# Patient Record
Sex: Male | Born: 1945 | Race: White | Hispanic: No | Marital: Married | Smoking: Former smoker
Health system: Southern US, Community
[De-identification: ages and names within clinical notes are randomized; demographics above are authoritative.]

## PROBLEM LIST (undated history)

## (undated) DIAGNOSIS — H8109 Meniere's disease, unspecified ear: Secondary | ICD-10-CM

## (undated) DIAGNOSIS — E785 Hyperlipidemia, unspecified: Secondary | ICD-10-CM

## (undated) DIAGNOSIS — M1990 Unspecified osteoarthritis, unspecified site: Secondary | ICD-10-CM

## (undated) DIAGNOSIS — H9192 Unspecified hearing loss, left ear: Secondary | ICD-10-CM

## (undated) DIAGNOSIS — I10 Essential (primary) hypertension: Secondary | ICD-10-CM

## (undated) DIAGNOSIS — H269 Unspecified cataract: Secondary | ICD-10-CM

## (undated) HISTORY — DX: Unspecified cataract: H26.9

## (undated) HISTORY — PX: POLYPECTOMY: SHX149

## (undated) HISTORY — DX: Hyperlipidemia, unspecified: E78.5

## (undated) HISTORY — DX: Essential (primary) hypertension: I10

## (undated) HISTORY — PX: COLONOSCOPY: SHX174

## (undated) HISTORY — DX: Meniere's disease, unspecified ear: H81.09

## (undated) HISTORY — DX: Unspecified hearing loss, left ear: H91.92

## (undated) HISTORY — PX: UPPER GASTROINTESTINAL ENDOSCOPY: SHX188

## (undated) HISTORY — DX: Unspecified osteoarthritis, unspecified site: M19.90

---

## 2005-03-15 ENCOUNTER — Ambulatory Visit: Payer: Self-pay | Admitting: Internal Medicine

## 2005-06-27 ENCOUNTER — Ambulatory Visit: Payer: Self-pay | Admitting: Internal Medicine

## 2005-07-20 ENCOUNTER — Ambulatory Visit: Payer: Self-pay | Admitting: Internal Medicine

## 2005-08-24 ENCOUNTER — Ambulatory Visit: Payer: Self-pay | Admitting: Internal Medicine

## 2005-09-26 ENCOUNTER — Ambulatory Visit: Payer: Self-pay | Admitting: Internal Medicine

## 2006-03-19 ENCOUNTER — Ambulatory Visit: Payer: Self-pay | Admitting: Internal Medicine

## 2006-03-26 ENCOUNTER — Ambulatory Visit: Payer: Self-pay | Admitting: Internal Medicine

## 2006-06-07 ENCOUNTER — Ambulatory Visit: Payer: Self-pay | Admitting: Internal Medicine

## 2006-06-19 ENCOUNTER — Ambulatory Visit: Payer: Self-pay | Admitting: Internal Medicine

## 2006-06-26 ENCOUNTER — Ambulatory Visit: Payer: Self-pay | Admitting: Internal Medicine

## 2006-06-26 LAB — CONVERTED CEMR LAB
Basophils Relative: 0.1 % (ref 0.0–1.0)
Hemoglobin: 14.9 g/dL (ref 13.0–17.0)
INR: 1 (ref 0.9–2.0)
Lymphocytes Relative: 31.7 % (ref 12.0–46.0)
MCHC: 33.9 g/dL (ref 30.0–36.0)
Monocytes Absolute: 0.7 10*3/uL (ref 0.2–0.7)
Monocytes Relative: 8.4 % (ref 3.0–11.0)
Neutro Abs: 5.1 10*3/uL (ref 1.4–7.7)
Platelets: 176 10*3/uL (ref 150–400)
Prothrombin Time: 12.4 s (ref 10.0–14.0)
RDW: 12.8 % (ref 11.5–14.6)
aPTT: 27.9 s (ref 26.5–36.5)

## 2006-07-11 ENCOUNTER — Ambulatory Visit: Payer: Self-pay | Admitting: Internal Medicine

## 2007-03-19 ENCOUNTER — Ambulatory Visit: Payer: Self-pay | Admitting: Internal Medicine

## 2007-03-20 ENCOUNTER — Ambulatory Visit: Payer: Self-pay | Admitting: Internal Medicine

## 2007-03-20 LAB — CONVERTED CEMR LAB
ALT: 23 units/L (ref 0–53)
AST: 27 units/L (ref 0–37)
Albumin: 3.7 g/dL (ref 3.5–5.2)
Alkaline Phosphatase: 69 units/L (ref 39–117)
BUN: 13 mg/dL (ref 6–23)
Basophils Absolute: 0.1 10*3/uL (ref 0.0–0.1)
Basophils Relative: 0.9 % (ref 0.0–1.0)
Bilirubin, Direct: 0.1 mg/dL (ref 0.0–0.3)
CO2: 28 meq/L (ref 19–32)
Calcium: 8.9 mg/dL (ref 8.4–10.5)
Chloride: 107 meq/L (ref 96–112)
Cholesterol: 189 mg/dL (ref 0–200)
Creatinine, Ser: 1 mg/dL (ref 0.4–1.5)
Eosinophils Absolute: 0.3 10*3/uL (ref 0.0–0.6)
Eosinophils Relative: 3.5 % (ref 0.0–5.0)
GFR calc Af Amer: 97 mL/min
GFR calc non Af Amer: 80 mL/min
Glucose, Bld: 85 mg/dL (ref 70–99)
HCT: 42 % (ref 39.0–52.0)
HDL: 40.7 mg/dL (ref >39.0)
Hemoglobin: 14.7 g/dL (ref 13.0–17.0)
LDL Cholesterol: 123 mg/dL — ABNORMAL HIGH (ref 0–99)
Lymphocytes Relative: 32.6 % (ref 12.0–46.0)
MCHC: 35 g/dL (ref 30.0–36.0)
MCV: 96.6 fL (ref 78.0–100.0)
Monocytes Absolute: 0.9 10*3/uL — ABNORMAL HIGH (ref 0.2–0.7)
Monocytes Relative: 12.3 % — ABNORMAL HIGH (ref 3.0–11.0)
Neutro Abs: 3.8 10*3/uL (ref 1.4–7.7)
Neutrophils Relative %: 50.7 % (ref 43.0–77.0)
PSA: 0.24 ng/mL (ref 0.10–4.00)
Platelets: 182 10*3/uL (ref 150–400)
Potassium: 4.1 meq/L (ref 3.5–5.1)
RBC: 4.35 M/uL (ref 4.22–5.81)
RDW: 13 % (ref 11.5–14.6)
Sodium: 136 meq/L (ref 135–145)
TSH: 1.11 microintl units/mL (ref 0.35–5.50)
Total Bilirubin: 0.8 mg/dL (ref 0.3–1.2)
Total CHOL/HDL Ratio: 4.6
Total Protein: 6.8 g/dL (ref 6.0–8.3)
Triglycerides: 126 mg/dL (ref 0–149)
VLDL: 25 mg/dL (ref 0–40)
WBC: 7.5 10*3/uL (ref 4.5–10.5)

## 2007-03-26 ENCOUNTER — Ambulatory Visit: Payer: Self-pay | Admitting: Internal Medicine

## 2007-04-22 DIAGNOSIS — I10 Essential (primary) hypertension: Secondary | ICD-10-CM | POA: Insufficient documentation

## 2007-04-22 DIAGNOSIS — L719 Rosacea, unspecified: Secondary | ICD-10-CM | POA: Insufficient documentation

## 2007-04-25 ENCOUNTER — Ambulatory Visit: Payer: Self-pay | Admitting: Internal Medicine

## 2007-04-25 DIAGNOSIS — C44300 Unspecified malignant neoplasm of skin of unspecified part of face: Secondary | ICD-10-CM

## 2007-08-05 ENCOUNTER — Telehealth: Payer: Self-pay | Admitting: Internal Medicine

## 2008-03-17 ENCOUNTER — Ambulatory Visit: Payer: Self-pay | Admitting: Internal Medicine

## 2008-03-17 LAB — CONVERTED CEMR LAB
Albumin: 3.8 g/dL (ref 3.5–5.2)
BUN: 12 mg/dL (ref 6–23)
Calcium: 8.9 mg/dL (ref 8.4–10.5)
Eosinophils Relative: 2.9 % (ref 0.0–5.0)
GFR calc Af Amer: 97 mL/min
Glucose, Bld: 99 mg/dL (ref 70–99)
Glucose, Urine, Semiquant: NEGATIVE
HCT: 45.2 % (ref 39.0–52.0)
Hemoglobin: 15.8 g/dL (ref 13.0–17.0)
Monocytes Absolute: 0.6 10*3/uL (ref 0.1–1.0)
Monocytes Relative: 9.3 % (ref 3.0–12.0)
Neutro Abs: 3.8 10*3/uL (ref 1.4–7.7)
Nitrite: NEGATIVE
RDW: 12.6 % (ref 11.5–14.6)
Specific Gravity, Urine: 1.025
TSH: 1.04 microintl units/mL (ref 0.35–5.50)
Total CHOL/HDL Ratio: 4.2
Total Protein: 6.5 g/dL (ref 6.0–8.3)
Triglycerides: 72 mg/dL (ref 0–149)
WBC: 6.9 10*3/uL (ref 4.5–10.5)
pH: 5.5

## 2008-03-25 ENCOUNTER — Ambulatory Visit: Payer: Self-pay | Admitting: Internal Medicine

## 2008-03-25 DIAGNOSIS — H9190 Unspecified hearing loss, unspecified ear: Secondary | ICD-10-CM | POA: Insufficient documentation

## 2008-03-25 DIAGNOSIS — E785 Hyperlipidemia, unspecified: Secondary | ICD-10-CM | POA: Insufficient documentation

## 2008-03-25 DIAGNOSIS — H8109 Meniere's disease, unspecified ear: Secondary | ICD-10-CM

## 2008-05-04 ENCOUNTER — Encounter: Payer: Self-pay | Admitting: Internal Medicine

## 2008-09-14 ENCOUNTER — Ambulatory Visit: Payer: Self-pay | Admitting: Internal Medicine

## 2008-09-14 LAB — CONVERTED CEMR LAB
ALT: 24 units/L (ref 0–53)
Alkaline Phosphatase: 56 units/L (ref 39–117)
Bilirubin, Direct: 0.1 mg/dL (ref 0.0–0.3)
Cholesterol: 185 mg/dL (ref 0–200)
LDL Cholesterol: 124 mg/dL — ABNORMAL HIGH (ref 0–99)
Total Protein: 6.5 g/dL (ref 6.0–8.3)

## 2008-09-23 ENCOUNTER — Ambulatory Visit: Payer: Self-pay | Admitting: Internal Medicine

## 2008-09-23 DIAGNOSIS — M24119 Other articular cartilage disorders, unspecified shoulder: Secondary | ICD-10-CM | POA: Insufficient documentation

## 2008-09-26 ENCOUNTER — Encounter: Admission: RE | Admit: 2008-09-26 | Discharge: 2008-09-26 | Payer: Self-pay | Admitting: Internal Medicine

## 2008-09-29 ENCOUNTER — Telehealth: Payer: Self-pay | Admitting: Internal Medicine

## 2008-09-29 DIAGNOSIS — M719 Bursopathy, unspecified: Secondary | ICD-10-CM

## 2008-09-29 DIAGNOSIS — M67919 Unspecified disorder of synovium and tendon, unspecified shoulder: Secondary | ICD-10-CM | POA: Insufficient documentation

## 2008-11-04 ENCOUNTER — Telehealth: Payer: Self-pay | Admitting: Internal Medicine

## 2008-11-04 ENCOUNTER — Ambulatory Visit: Payer: Self-pay | Admitting: Internal Medicine

## 2008-11-04 DIAGNOSIS — R05 Cough: Secondary | ICD-10-CM | POA: Insufficient documentation

## 2009-03-22 ENCOUNTER — Ambulatory Visit: Payer: Self-pay | Admitting: Internal Medicine

## 2009-03-22 LAB — CONVERTED CEMR LAB
ALT: 17 units/L (ref 0–53)
AST: 27 units/L (ref 0–37)
Albumin: 3.8 g/dL (ref 3.5–5.2)
Alkaline Phosphatase: 52 units/L (ref 39–117)
Basophils Relative: 0 % (ref 0.0–3.0)
Bilirubin, Direct: 0.1 mg/dL (ref 0.0–0.3)
CO2: 30 meq/L (ref 19–32)
Calcium: 9.1 mg/dL (ref 8.4–10.5)
Eosinophils Relative: 3.7 % (ref 0.0–5.0)
GFR calc non Af Amer: 79.59 mL/min (ref >60)
Hemoglobin: 14.5 g/dL (ref 13.0–17.0)
Ketones, urine, test strip: NEGATIVE
Lymphocytes Relative: 33.5 % (ref 12.0–46.0)
MCHC: 34.7 g/dL (ref 30.0–36.0)
Monocytes Relative: 10.9 % (ref 3.0–12.0)
Neutro Abs: 3.6 10*3/uL (ref 1.4–7.7)
Nitrite: NEGATIVE
RBC: 4.3 M/uL (ref 4.22–5.81)
Sodium: 143 meq/L (ref 135–145)
Total CHOL/HDL Ratio: 4
Total Protein: 6.9 g/dL (ref 6.0–8.3)
Triglycerides: 67 mg/dL (ref 0.0–149.0)
Urobilinogen, UA: 0.2
VLDL: 13.4 mg/dL (ref 0.0–40.0)
WBC: 7.1 10*3/uL (ref 4.5–10.5)

## 2009-03-26 ENCOUNTER — Ambulatory Visit: Payer: Self-pay | Admitting: Internal Medicine

## 2009-03-26 DIAGNOSIS — M7730 Calcaneal spur, unspecified foot: Secondary | ICD-10-CM | POA: Insufficient documentation

## 2009-05-03 ENCOUNTER — Encounter: Payer: Self-pay | Admitting: Internal Medicine

## 2009-09-01 ENCOUNTER — Telehealth: Payer: Self-pay | Admitting: Internal Medicine

## 2009-09-01 ENCOUNTER — Emergency Department (HOSPITAL_COMMUNITY): Admission: EM | Admit: 2009-09-01 | Discharge: 2009-09-01 | Payer: Self-pay | Admitting: Emergency Medicine

## 2009-09-07 ENCOUNTER — Telehealth: Payer: Self-pay | Admitting: Internal Medicine

## 2009-09-08 ENCOUNTER — Encounter: Admission: RE | Admit: 2009-09-08 | Discharge: 2009-09-08 | Payer: Self-pay | Admitting: Internal Medicine

## 2009-09-08 ENCOUNTER — Ambulatory Visit: Payer: Self-pay | Admitting: Internal Medicine

## 2009-09-08 DIAGNOSIS — IMO0002 Reserved for concepts with insufficient information to code with codable children: Secondary | ICD-10-CM

## 2009-09-21 ENCOUNTER — Encounter: Admission: RE | Admit: 2009-09-21 | Discharge: 2009-10-08 | Payer: Self-pay | Admitting: Internal Medicine

## 2009-09-22 ENCOUNTER — Encounter: Payer: Self-pay | Admitting: Internal Medicine

## 2009-10-07 ENCOUNTER — Ambulatory Visit: Payer: Self-pay | Admitting: Internal Medicine

## 2009-11-04 ENCOUNTER — Telehealth: Payer: Self-pay | Admitting: Internal Medicine

## 2010-03-28 ENCOUNTER — Ambulatory Visit: Payer: Self-pay | Admitting: Internal Medicine

## 2010-03-28 LAB — CONVERTED CEMR LAB
ALT: 23 units/L (ref 0–53)
AST: 27 units/L (ref 0–37)
Albumin: 4 g/dL (ref 3.5–5.2)
Alkaline Phosphatase: 62 units/L (ref 39–117)
Basophils Relative: 0.4 % (ref 0.0–3.0)
Cholesterol: 213 mg/dL — ABNORMAL HIGH (ref 0–200)
Direct LDL: 131.8 mg/dL
Eosinophils Absolute: 0.2 10*3/uL (ref 0.0–0.7)
Eosinophils Relative: 2 % (ref 0.0–5.0)
Hemoglobin: 14.4 g/dL (ref 13.0–17.0)
Lymphocytes Relative: 27.3 % (ref 12.0–46.0)
MCHC: 34.1 g/dL (ref 30.0–36.0)
MCV: 98.2 fL (ref 78.0–100.0)
Neutro Abs: 5.3 10*3/uL (ref 1.4–7.7)
RBC: 4.31 M/uL (ref 4.22–5.81)
WBC: 9.1 10*3/uL (ref 4.5–10.5)

## 2010-04-06 ENCOUNTER — Ambulatory Visit: Payer: Self-pay | Admitting: Internal Medicine

## 2010-04-06 LAB — CONVERTED CEMR LAB

## 2010-07-01 ENCOUNTER — Ambulatory Visit: Payer: Self-pay | Admitting: Internal Medicine

## 2010-07-01 LAB — CONVERTED CEMR LAB
ALT: 23 units/L (ref 0–53)
Albumin: 3.7 g/dL (ref 3.5–5.2)
Bilirubin, Direct: 0.1 mg/dL (ref 0.0–0.3)
Direct LDL: 145.8 mg/dL
HDL: 47.4 mg/dL (ref >39.00)
Total Protein: 6.4 g/dL (ref 6.0–8.3)
Triglycerides: 84 mg/dL (ref 0.0–149.0)
VLDL: 16.8 mg/dL (ref 0.0–40.0)

## 2010-07-29 ENCOUNTER — Ambulatory Visit: Payer: Self-pay | Admitting: Internal Medicine

## 2010-07-29 LAB — CONVERTED CEMR LAB
Cholesterol, target level: 200 mg/dL
LDL Goal: 130 mg/dL

## 2010-10-02 ENCOUNTER — Encounter: Payer: Self-pay | Admitting: Internal Medicine

## 2010-10-12 NOTE — Assessment & Plan Note (Signed)
Summary: 1 month rov/njr   Vital Signs:  Patient profile:   65 year old male Height:      68 inches Weight:      198 pounds BMI:     30.21 Temp:     98.2 degrees F oral Pulse rate:   72 / minute Resp:     14 per minute BP sitting:   130 / 80  (left arm)  Vitals Entered By: Willy Eddy, LPN (October 07, 2009 10:15 AM) CC: roa - back pain has improved, Hypertension Management   CC:  roa - back pain has improved and Hypertension Management.  History of Present Illness: PAIN CNTROL ACHIEVED WITH PT reviewed the pt results  Hypertension History:      He denies headache, chest pain, palpitations, dyspnea with exertion, orthopnea, PND, peripheral edema, visual symptoms, neurologic problems, syncope, and side effects from treatment.        Positive major cardiovascular risk factors include male age 37 years old or older, hyperlipidemia, and hypertension.  Negative major cardiovascular risk factors include non-tobacco-user status.     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 Prn 2)  Valium 5 Mg  Tabs (Diazepam) .Marland Kitchen.. 1 Once Daily As Needed 3)  Fish Oil Concentrate 1000 Mg  Caps (Omega-3 Fatty Acids) .... Two By Mouth Once Daily 4)  Lotensin 20 Mg  Tabs  (Benazepril Hcl) .... One By Mouth Daily 5)  Methylprednisolone 4 Mg Tabs (Methylprednisolone) .... 4 By Mouth Daily For 4 Days The 3 By Mouth For 4 Days Then 2 By Mouth For 4 Days Then 1 By Mouth For 4 Days 6)  Cyclobenzaprine Hcl 10 Mg Tabs (Cyclobenzaprine Hcl) .... One By Mouth Tid 7)  Oxycontin 20 Mg Xr12h-Tab (Oxycodone Hcl) .... One By Mouth Two Times A Day Prn Pain  Current Medications (verified): 1)  Metrogel 1 %  Gel (Metronidazole) .... Prn 2)  Valium 5 Mg  Tabs (Diazepam) .Marland Kitchen.. 1 Once Daily As Needed 3)  Fish Oil Concentrate 1000 Mg  Caps (Omega-3 Fatty Acids) .... Two By Mouth Once Daily 4)  Lotensin 20 Mg  Tabs (Benazepril Hcl) .... One By Mouth Daily  Allergies (verified): 1)  ! Tetracycline Hcl (Tetracycline Hcl) 2)  Percocet  Past History:  Family History: Last updated: 04/23/2007 Family History Gangreene Family History CVA Family History Hypertension  Social History: Last updated: 04/25/2007 Retired Married Never Smoked  Risk Factors: Alcohol Use: 2 (10/07/2009) Caffeine Use: 2 (04/25/2007) Exercise: yes (04/25/2007)  Risk Factors: Smoking Status: never (10/07/2009) Passive Smoke Exposure: no (10/07/2009)  Past medical, surgical, family and social histories (including risk factors) reviewed, and no changes noted (except as noted below).  Past Medical History: Reviewed history from 04/23/2007 and no changes  required. Chest Pain Meinere Disease Left Ear Hearing Loss  Past Surgical History: Reviewed history from 04/23/2007 and no changes required. Colonoscopy  Family History: Reviewed history from 04/23/2007 and no changes required. Family History Gangreene Family History CVA Family History Hypertension  Social History: Reviewed history from 04/25/2007 and no changes required. Retired Married Never Smoked  Review of Systems  The patient denies anorexia, fever, weight loss, weight gain, vision loss, decreased hearing, hoarseness, chest  pain, syncope, dyspnea on exertion, peripheral edema, prolonged cough, headaches, hemoptysis, abdominal pain, melena, hematochezia, severe indigestion/heartburn, hematuria, incontinence, genital sores, muscle weakness, suspicious skin lesions, transient blindness, difficulty walking, depression, unusual weight change, abnormal bleeding, enlarged lymph nodes, angioedema, breast masses, and testicular masses.    Physical Exam  General:  anxious appearing.   Head:  normocephalic and atraumatic.   Eyes:  pupils equal and pupils round.   Ears:  R ear normal and L ear normal.   Nose:  no external deformity and no nasal discharge.   Mouth:  Oral mucosa and oropharynx without lesions or exudates.  Teeth in good repair. Neck:  No deformities, masses, or tenderness noted. Lungs:  normal respiratory effort and no wheezes.   Heart:  normal rate and regular rhythm.     Impression & Recommendations:  Problem # 1:  LUMBAR RADICULOPATHY, LEFT (ICD-724.4)  The following medications were removed from the medication list:    Cyclobenzaprine Hcl 10 Mg Tabs (Cyclobenzaprine hcl) ..... One by mouth tid    Oxycontin 20 Mg Xr12h-tab (Oxycodone hcl) ..... One by mouth two times a day prn pain  Discussed use of moist heat or ice, modified activities, medications, and stretching/strengthening exercises. Back care instructions given. To be seen in 2 weeks if no improvement; sooner if worsening of symptoms.   Problem # 2:  HYPERLIPIDEMIA, MILD (ICD-272.4)  Labs Reviewed: SGOT: 27 (03/22/2009)   SGPT: 17 (03/22/2009)  10 Yr Risk Heart Disease: 18 % Prior 10 Yr Risk Heart Disease: 14 % (03/26/2009)   HDL:58.50 (03/22/2009), 49.2 (09/14/2008)  LDL:124 (09/14/2008), 132 (03/17/2008)  Chol:208 (03/22/2009), 185 (09/14/2008)  Trig:67.0 (03/22/2009), 59 (09/14/2008)  Problem # 3:  HYPERTENSION (ICD-401.9)  His updated medication list for this problem includes:    Lotensin 20 Mg Tabs (Benazepril hcl) ..... One by  mouth daily  BP today: 130/80 Prior BP: 144/80 (09/08/2009)  10 Yr Risk Heart Disease: 18 % Prior 10 Yr Risk Heart Disease: 14 % (03/26/2009)  Labs Reviewed: K+: 4.7 (03/22/2009) Creat: : 1.0 (03/22/2009)   Chol: 208 (03/22/2009)   HDL: 58.50 (03/22/2009)   LDL: 124 (09/14/2008)   TG: 67.0 (03/22/2009)  Problem # 4:  MENIERE'S DISEASE (ICD-386.00) stable  Complete Medication List: 1)  Metrogel 1 % Gel (Metronidazole) .... Prn 2)  Valium 5 Mg Tabs (Diazepam) .Marland Kitchen.. 1 once daily as needed 3)  Fish Oil Concentrate 1000 Mg Caps (Omega-3 fatty acids) .... Two by mouth once daily 4)  Lotensin 20 Mg Tabs (Benazepril hcl) .... One by mouth daily  Hypertension Assessment/Plan:      The patient's hypertensive risk group is category B: At least one risk factor (excluding diabetes) with no target organ damage.  His calculated 10 year risk of coronary heart disease is 18 %.  Today's blood pressure is 130/80.  His blood pressure goal is < 140/90.  Patient Instructions: 1)  six months 2)  30 min "initial medicare welness"  3)  Hepatic Panel prior to visit, ICD-9:995.20 4)  Lipid Panel prior to visit, ICD-9:272.4  5)  TSH prior to visit, ICD-9:272.4 6)  CBC w/ Diff prior to visit, ICD-9:995.20 7)  PSA prior to visit, ICD-9: 601.0

## 2010-10-12 NOTE — Assessment & Plan Note (Signed)
Summary: 30 min preventive/njr   Vital Signs:  Patient profile:   65 year old male Height:      68 inches Weight:      194 pounds BMI:     29.60 Temp:     98.2 degrees F oral Pulse rate:   72 / minute Resp:     14 per minute BP sitting:   120 / 70  (left arm)  Vitals Entered By: Willy Eddy, LPN (April 06, 2010 12:10 PM)  Contraindications/Deferment of Procedures/Staging:    Test/Procedure: Pneumovax vaccine    Reason for deferment: patient declined  CC: annual visit for disease management  Is Patient Diabetic? No  Vision Screening:Left eye with correction: 20 / 20 Right eye with correction: 20 / 20 Both eyes with correction: 20 / 20  Color vision testing: normal       CC:  annual visit for disease management .  History of Present Illness: Here for Medicare AWV:  1.   Risk factors based on Past M, S, F history: reviewed and documented in the chart 2.   Physical Activities:   active with daily walking 3.   Depression/mood: no depression 4.   Hearing: left ear loss of hearing right ear can hear whispered voice at 6 feet  5.   ADL's: normal 6.   Fall Risk: none 7.   Home Safety: lives in a multistory home with no personal fall risk due to normal ambulation 8.   Height, weight, &visual acuity: see chart 9.   Counseling: see forms 10.   Labs ordered based on risk factors:  reviewed wth pt 11.           Referral Coordination no curretn active referral 12.           Care Plan  see paln 13.            Cognitive Assessment  allert oriented without depression  has two skin tags on his neck that are irritatied has HTN that is well controlled today the lipid control is not optimal and he admits to intermitant use of the fish oil and has not lost weight  Preventive Screening-Counseling & Management  Alcohol-Tobacco     Alcohol drinks/day: 2     Alcohol type: beer     Smoking Status: never     Passive Smoke Exposure: no  Caffeine-Diet-Exercise     Caffeine  use/day: yes/2 cups     Does Patient Exercise: yes  Hep-HIV-STD-Contraception     Sun Exposure-Excessive: yes  Safety-Violence-Falls     Seat Belt Use: yes     Violence in the Home: no risk noted     Fall Risk: none      Sexual History:  currently monogamous.        Drug Use:  never.        Blood Transfusions:  no.    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) 11)  Rosacea  (ICD-695.3) 12)  Hypertension  (ICD-401.9)  Current Problems (verified): 1)  Lumbar Radiculopathy, Left  (ICD-724.4) 2)  Calcaneal Spur  (  ICD-726.73) 3)  Cough, Chronic  (ICD-786.2) 4)  Rotator Cuff Injury, Left Shoulder  (ICD-726.10) 5)  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 Prn 2)  Valium 5 Mg  Tabs (Diazepam) .Marland Kitchen.. 1 Once Daily As Needed 3)  Fish Oil Concentrate 1000 Mg  Caps (Omega-3 Fatty Acids) .... Two By Mouth Once Daily 4)  Lotensin 20 Mg  Tabs (Benazepril Hcl) .... One By Mouth Daily 5)  Analpram-Hc 1-2.5 % Crea (Hydrocortisone Ace-Pramoxine) .... Apply Bid  Current Medications (verified): 1)  Metrogel 1 %  Gel (Metronidazole) .... Apply To Face Bid 2)  Valium 5 Mg  Tabs (Diazepam) .Marland Kitchen.. 1 Once Daily As Needed 3)  Fish Oil  Concentrate 1000 Mg  Caps (Omega-3 Fatty Acids) .... Three  By Mouth Once Daily 4)  Lotensin 20 Mg  Tabs (Benazepril Hcl) .... One By Mouth Daily 5)  Analpram-Hc 1-2.5 % Crea (Hydrocortisone Ace-Pramoxine) .... Apply Bid  Allergies (verified): 1)  ! Tetracycline Hcl (Tetracycline Hcl) 2)  Percocet  Past History:  Family History: Last updated: 04/23/2007 Family History Gangreene Family History CVA Family History Hypertension  Social History: Last updated: 04/25/2007 Retired Married Never Smoked  Risk Factors: Alcohol Use: 2 (04/06/2010) Caffeine Use: yes/2 cups (04/06/2010) Exercise: yes (04/06/2010)  Risk Factors: Smoking Status: never (04/06/2010) Passive Smoke Exposure: no (04/06/2010)  Past medical, surgical, family and social histories (including risk factors) reviewed, and no changes noted (except as noted below).  Past Medical History: Reviewed history from 04/23/2007 and no changes required. Chest Pain Meinere Disease Left Ear Hearing Loss  Past Surgical History: Reviewed history from 04/23/2007 and no changes required. Colonoscopy  Family History: Reviewed history from 04/23/2007 and no changes required. Family History Gangreene Family History CVA Family History Hypertension  Social History: Reviewed history from 04/25/2007 and no changes required. 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:  yes Fall Risk:  none Sexual History:  currently monogamous Drug Use:  never Blood Transfusions:  no  Review of Systems  The patient denies anorexia, fever, weight loss, weight gain, vision loss, decreased hearing, hoarseness, chest pain, syncope, dyspnea on exertion, peripheral edema, prolonged cough, headaches, hemoptysis, abdominal pain, melena, hematochezia, severe indigestion/heartburn, hematuria, incontinence, genital sores, muscle weakness, suspicious skin lesions, transient blindness,  difficulty walking, depression, unusual weight change, abnormal bleeding, enlarged lymph nodes, angioedema, breast masses, and testicular masses.    Physical Exam  General:  well-developed and overweight-appearing.   Head:  normocephalic and atraumatic.   Eyes:  vision grossly intact, pupils equal, and pupils round.   Ears:  R ear normal and L ear normal.   Nose:  no nasal discharge and external deformity.   Mouth:  pharynx pink and moist and no erythema.   Neck:  No deformities, masses, or tenderness noted. skin tags Chest Wall:  No deformities, masses, tenderness or gynecomastia noted.barrel-chest deformity.   Breasts:  no gynecomastia.   Lungs:  normal respiratory effort and no wheezes.   Heart:  normal rate and regular rhythm.   Abdomen:  soft, non-tender, and normal bowel sounds.   Rectal:  external hemorrhoid(s) and internal  hemorrhoid(s).   Genitalia:  circumcised and no urethral discharge.   Prostate:  no gland enlargement and no asymmetry.   Msk:  No deformity or scoliosis noted of thoracic or lumbar spine.   Extremities:  No clubbing, cyanosis, edema, or deformity noted with normal full range of motion of all joints.   Neurologic:  alert & oriented X3 and finger-to-nose normal.   Cervical Nodes:  No lymphadenopathy noted Axillary Nodes:  No palpable lymphadenopathy Psych:  not depressed appearing.     Impression & Recommendations:  Problem # 1:  PREVENTIVE HEALTH CARE (ICD-V70.0)  The pt was asked about all immunizations, health maint. services that are appropriate to their age and was given guidance on diet exercize  and weight management pnemovax given today Colonoscopy: historical (09/11/2002) Td Booster: Tdap (09/11/2005)   Chol: 213 (03/28/2010)   HDL: 58.30 (03/28/2010)   LDL: 124 (09/14/2008)   TG: 159.0 (03/28/2010) TSH: 1.18 (03/28/2010)   PSA: 0.17 (03/28/2010) Next Colonoscopy due:: 09/11/2012 (04/06/2010)  Discussed using sunscreen, use of alcohol, drug  use, self testicular exam, routine dental care, routine eye care, routine physical exam, seat belts, multiple vitamins, osteoporosis prevention, adequate calcium intake in diet, and recommendations for immunizations.  Discussed exercise and checking cholesterol.  Discussed gun safety, safe sex, and contraception. Also recommend checking PSA.  Orders: First annual wellness visit with prevention plan  ([id number])  Problem # 2:  HYPERLIPIDEMIA, MILD (ICD-272.4) Assessment: Deteriorated  complience with diet and medicatons stressed and goals for weight loss set monitering in 3 months  Labs Reviewed: SGOT: 27 (03/28/2010)   SGPT: 23 (03/28/2010)  Prior 10 Yr Risk Heart Disease: 18 % (10/07/2009)   HDL:58.30 (03/28/2010), 58.50 (03/22/2009)  LDL:124 (09/14/2008), 132 (03/17/2008)  Chol:213 (03/28/2010), 208 (03/22/2009)  Trig:159.0 (03/28/2010), 67.0 (03/22/2009)  Complete Medication List: 1)  Metrogel 1 % Gel (Metronidazole) .... Apply to face bid 2)  Valium 5 Mg Tabs (Diazepam) .Marland Kitchen.. 1 once daily as needed 3)  Fish Oil Concentrate 1000 Mg Caps (Omega-3 fatty acids) .... Three  by mouth once daily 4)  Lotensin 20 Mg Tabs (Benazepril hcl) .... One by mouth daily 5)  Analpram-hc 1-2.5 % Crea (Hydrocortisone ace-pramoxine) .... Apply bid  Patient Instructions: 1)  Please schedule a follow-up appointment in 3 months. 2)  Hepatic Panel prior to visit, ICD-9:995.20 3)  Lipid Panel prior to visit, ICD-9:272.4 Prescriptions: METROGEL 1 %  GEL (METRONIDAZOLE) apply to face bid  #60 cc x 5   Entered and Authorized by:   Stacie Glaze MD   Signed by:   Stacie Glaze MD on 04/06/2010   Method used:   Electronically to        [facility] (retail)       [address]       [place], [place]  [address]       Ph: [phone]       Fax: [phone]   RxID:   [id number] LOTENSIN 20 MG  TABS (BENAZEPRIL HCL) one by mouth daily  #30 x 11   Entered and Authorized  by:   Stacie Glaze MD   Signed by:   Stacie Glaze MD on 04/06/2010   Method used:   Electronically to        [facility] (retail)       [address]       [facility]       [place], [place]  [address]       Ph: [phone]       Fax: [phone]   RxID:   [id number] VALIUM 5 MG  TABS (DIAZEPAM) 1 once daily as needed  #30 x 5   Entered and Authorized by:   Stacie Glaze MD   Signed by:   Stacie Glaze MD on 04/06/2010   Method used:   Print then Give to Patient   RxID:   [id number] LOTENSIN 20 MG  TABS (BENAZEPRIL HCL) one by mouth daily  #30 x 11   Entered and Authorized by:   Stacie Glaze MD   Signed by:   Stacie Glaze MD on 04/06/2010   Method used:   Telephoned to ...       [facility])       [address]       [place], [place]  [address]       Ph: [phone]       Fax: [phone]   RxID:   [id number] VALIUM 5 MG  TABS (DIAZEPAM) 1 once daily as needed  #30 x 1   Entered by:   Willy Eddy, LPN   Authorized by:   Stacie Glaze MD   Signed by:   Willy Eddy, LPN on 42/70/6237   Method used:   Telephoned to ...       [facility] (retail)       [address]       [place], [place]  [address]       Ph: [phone]       Fax: [phone]   RxID:   [id number] LOTENSIN 20 MG  TABS (BENAZEPRIL HCL) one by mouth daily  #90 Each x 3   Entered by:   Willy Eddy, LPN   Authorized by:   Stacie Glaze MD   Signed by:   Willy Eddy, LPN on 37/16/9678   Method used:   Electronically to        [facility] ([facility])       [address]       [place], [place]  [address]       Ph: [phone]       Fax: [phone]   RxID:   [id number] METROGEL 1 %  GEL (METRONIDAZOLE) prn  #60 gm x 3   Entered by:   Willy Eddy, LPN   Authorized by:   Stacie Glaze MD   Signed by:   Willy Eddy, LPN on 09/32/6712   Method used:   Electronically to        [facility] ([facility])       [address]       [place], [place]  [address]       Ph: [phone]       Fax: [phone]   RxID:   [id number]   Prevention & Chronic Care Immunizations   Influenza vaccine: Not documented   Influenza vaccine deferral: Refused  (04/06/2010)    Tetanus booster: 09/11/2005: Tdap   Tetanus booster due: 09/12/2015    Pneumococcal vaccine: Not documented   Pneumococcal vaccine deferral: patient declined  (04/06/2010)    H. zoster vaccine: Not documented   H. zoster vaccine deferral: Refused  (  04/06/2010)    Immunization comments: pt did agree to the pnemonia shot  Colorectal Screening   Hemoccult: Not documented    Colonoscopy: historical  (09/11/2002)   Colonoscopy due: 09/11/2012  Other Screening   PSA: 0.17  (03/28/2010)   PSA action/deferral: Discussed-PSA requested  (04/06/2010)   Smoking status: never  (04/06/2010)    Screening comments: see labs pordered  Lipids   Total Cholesterol: 213  (03/28/2010)   Lipid panel action/deferral: Lipid Panel ordered   LDL: 124  (09/14/2008)   LDL Direct: 131.8  (03/28/2010)   HDL: 58.30  (03/28/2010)   Triglycerides: 159.0  (03/28/2010)    SGOT (AST): 27  (03/28/2010)   BMP action: Ordered   SGPT (ALT): 23  (03/28/2010)   Alkaline phosphatase: 62  (03/28/2010)   Total bilirubin: 1.0  (03/28/2010)    Lipid flowsheet reviewed?: Yes   Progress toward LDL goal: Improved   Lipid comments: needs to be consistant with the fish oil  Hypertension   Last Blood Pressure: 120 / 70  (04/06/2010)   Serum creatinine: 1.0  (03/22/2009)   Serum potassium 4.7  (03/22/2009)    Hypertension flowsheet reviewed?: Yes   Progress toward BP goal: At goal  Self-Management Support :    Patient will work on the following items until the next clinic visit to reach self-care goals:     Medications  and monitoring: take my medicines every day  (04/06/2010)     Eating: limit or avoid alcohol  (04/06/2010)     Activity: take a 30 minute walk every day  (04/06/2010)    Hypertension self-management support: BP self-monitoring log  (04/06/2010)    Lipid self-management support: Lipid monitoring log  (04/06/2010)    Appended Document: Orders Update    Clinical Lists Changes  Orders: Added new Service order of State-Pneumococcal Vaccine [phone]) - Signed Added new Service order of Admin 1st Vaccine ([id number]) - Signed Observations: Added new observation of PNEUMOVAXLOT: 0386aa (04/06/2010 14:02) Added new observation of PNEUMOVAXEXP: 08/11/2011 (04/06/2010 14:02) Added new observation of PNEUMOVAXBY: rachel vereen,cma (04/06/2010 14:02) Added new observation of PNEUMOVAXRTE: IM (04/06/2010 14:02) Added new observation of PNEUMOVAXDOS: 0.5 ml (04/06/2010 14:02) Added new observation of PNEUMOVAXMFR: Merck (04/06/2010 14:02) Added new observation of PNEUMOVAXSIT: right deltoid (04/06/2010 14:02) Added new observation of PNEUMOVAX: Pneumovax (State) (04/06/2010 14:02)       Immunizations Administered:  Pneumonia Vaccine:    Vaccine Type: Pneumovax (State)    Site: right deltoid    Mfr: Merck    Dose: 0.5 ml    Route: IM    Given by: rachel vereen,cma    Exp. Date: 08/11/2011    Lot #: [id number]   Appended Document: 30 min preventive/njr      History of Present Illness: pt was alert and oriented, had good cognitive skills, motor skill and functioning status has good memory and participates appropriately in both business and daily activity   Allergies: 1)  ! Tetracycline Hcl (Tetracycline Hcl) 2)  Percocet   Complete Medication List: 1)  Metrogel 1 % Gel (Metronidazole) .... Apply to face bid 2)  Valium 5 Mg Tabs (Diazepam) .Marland Kitchen.. 1 once daily as needed 3)  Fish Oil Concentrate 1000 Mg Caps (Omega-3 fatty acids) .... Three  by mouth once daily 4)  Lotensin 20 Mg  Tabs (Benazepril hcl) .... One by mouth daily 5)  Analpram-hc 1-2.5 % Crea (Hydrocortisone ace-pramoxine) .... Apply bid   Prevention & Chronic Care Immunizations   Influenza vaccine: Not documented  Influenza vaccine deferral: Refused  (04/06/2010)    Tetanus booster: 09/11/2005: Tdap   Tetanus booster due: 09/12/2015    Pneumococcal vaccine: Pneumovax (State)  (04/06/2010)   Pneumococcal vaccine deferral: patient declined  (04/06/2010)    H. zoster vaccine: Not documented   H. zoster vaccine deferral: Refused  (04/06/2010)  Colorectal Screening   Hemoccult: Not documented    Colonoscopy: historical  (09/11/2002)   Colonoscopy due: 09/11/2012  Other Screening   PSA: 0.17  (03/28/2010)   PSA action/deferral: Discussed-PSA requested  (04/06/2010)   Smoking status: never  (04/06/2010)  Lipids   Total Cholesterol: 213  (03/28/2010)   Lipid panel action/deferral: Lipid Panel ordered   LDL: 124  (09/14/2008)   LDL Direct: 131.8  (03/28/2010)   HDL: 58.30  (03/28/2010)   Triglycerides: 159.0  (03/28/2010)    SGOT (AST): 27  (03/28/2010)   BMP action: Ordered   SGPT (ALT): 23  (03/28/2010)   Alkaline phosphatase: 62  (03/28/2010)   Total bilirubin: 1.0  (03/28/2010)  Hypertension   Last Blood Pressure: 120 / 70  (04/06/2010)   Serum creatinine: 1.0  (03/22/2009)   Serum potassium 4.7  (03/22/2009)  Self-Management Support :    Hypertension self-management support: BP self-monitoring log  (04/06/2010)    Lipid self-management support: Lipid monitoring log  (04/06/2010)

## 2010-10-12 NOTE — Miscellaneous (Signed)
Summary: Discharge Summary for PT [facility]  Discharge Summary for PT Services/[place]   Imported By: Maryln Gottron 10/11/2009 13:39:46  _____________________________________________________________________  External Attachment:    Type:   Image     Comment:   External Document

## 2010-10-12 NOTE — Miscellaneous (Signed)
Summary: Initial Summary for PT Services/[facility]  Initial Summary for PT Services/[facility]   Imported By: Maryln Gottron 09/28/2009 13:05:26  _____________________________________________________________________  External Attachment:    Type:   Image     Comment:   External Document

## 2010-10-12 NOTE — Progress Notes (Signed)
  Phone Note Call from Patient   Caller: Spouse Call For: Stacie Glaze MD Summary of Call: Painful hemorrhoids.....Marland KitchenMarland KitchenNeeds RX. [facility]) [phone] Initial call taken by: Lynann Beaver CMA,  November 04, 2009 3:18 PM  Follow-up for Phone Call        analpram HC 2.5 % GENERIC MAY APPLY pr two times a day  Follow-up by: Stacie Glaze MD,  November 05, 2009 9:29 AM    New/Updated Medications: ANALPRAM-HC 1-2.5 % CREA (HYDROCORTISONE ACE-PRAMOXINE) apply bid Prescriptions: ANALPRAM-HC 1-2.5 % CREA (HYDROCORTISONE ACE-PRAMOXINE) apply bid  #1 tube x 2   Entered by:   Lynann Beaver CMA   Authorized by:   Stacie Glaze MD   Signed by:   Lynann Beaver CMA on 11/05/2009   Method used:   [facility]        [facility]       [address]       [place], [place]  [address]       Ph: [phone]       Fax: [phone]   RxID:   [id number]  Pt. notified.

## 2010-10-12 NOTE — Assessment & Plan Note (Signed)
Summary: 3 mo rov/mm/pt rsc/cjr   Vital Signs:  Patient profile:   65 year old male Height:      68 inches Weight:      202 pounds BMI:     30.83 Temp:     98.2 degrees F oral Pulse rate:   72 / minute Resp:     14 per minute BP sitting:   126 / 80  (left arm)  Vitals Entered By: Willy Eddy, LPN (July 29, 2010 3:46 PM) CC: roa  labs, Lipid Management Is Patient Diabetic? No   Primary Care Provider:  Stacie Glaze MD  CC:  roa  labs and Lipid Management.  History of Present Illness:  Follow-Up Visit      This is a 65 year old man who presents for Follow-up visit.  The patient denies chest pain, palpitations, dizziness, syncope, low blood sugar symptoms, high blood sugar symptoms, edema, SOB, DOE, PND, and orthopnea.  Since the last visit the patient notes no new problems or concerns.  The patient reports taking meds as prescribed, monitoring BP, and dietary compliance.    Lipid Management History:      Positive NCEP/ATP III risk factors include male age 23 years old or older and hypertension.  Negative NCEP/ATP III risk factors include non-tobacco-user status.     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) 11)  Rosacea  (ICD-695.3) 12)  Hypertension  (ICD-401.9)  Current Problems (verified): 1)  Lumbar Radiculopathy, Left   (ICD-724.4) 2)  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 Apply To Face Bid 2)  Valium 5 Mg  Tabs (Diazepam) .Marland Kitchen.. 1 Once Daily As Needed 3)  Fish Oil Concentrate 1000 Mg  Caps (Omega-3 Fatty Acids) .... Three  By Mouth Once Daily 4)  Lotensin 20 Mg  Tabs (Benazepril Hcl) .... One By Mouth Daily 5)  Analpram-Hc 1-2.5 % Crea (Hydrocortisone Ace-Pramoxine) .... Apply Bid  Current Medications (verified): 1)  Metrogel 1 %  Gel (Metronidazole) .... Apply To Face Bid 2)  Valium 5 Mg  Tabs (Diazepam) .Marland Kitchen.. 1 Once Daily As Needed 3)  Fish Oil 1200 Mg Caps (Omega-3 Fatty Acids) .... 2 Once Daily 4)  Lotensin 20 Mg  Tabs (Benazepril Hcl) .... One By Mouth Daily 5)  Analpram-Hc 1-2.5 % Crea (Hydrocortisone Ace-Pramoxine) .... Apply Bid 6)  Livalo  4 Mg Tabs (Pitavastatin Calcium) .... One By Mouth  Every Friday Night Before Bed  Allergies (verified): 1)  ! Tetracycline Hcl (Tetracycline Hcl) 2)  Percocet  Past History:  Family History: Last updated: 04/23/2007 Family History Gangreene Family History CVA Family History Hypertension  Social History: Last updated: 04/25/2007 Retired Married Never Smoked  Risk Factors: Alcohol Use: 2 (07/29/2010) Caffeine Use: yes/2 cups (04/06/2010) Exercise: yes (04/06/2010)  Risk Factors: Smoking Status: never (07/29/2010) Passive Smoke Exposure: no (07/29/2010)  Past medical, surgical, family and social histories (including risk factors) reviewed, and no changes noted (except  as noted below).  Past Medical History: Reviewed history from 04/23/2007 and no changes required. Chest Pain Meinere Disease Left Ear Hearing Loss  Past Surgical History: Reviewed history from 04/23/2007 and no changes required. Colonoscopy  Family History: Reviewed history from 04/23/2007 and no changes required. Family History Gangreene Family History CVA Family History Hypertension  Social History: Reviewed history from 04/25/2007 and no changes required. Retired Married Never Smoked  Review of Systems  The patient denies anorexia, fever, weight loss, weight gain, vision loss, decreased hearing, hoarseness, chest pain, syncope, dyspnea on exertion, peripheral edema, prolonged cough, headaches, hemoptysis, abdominal pain, melena, hematochezia, severe indigestion/heartburn, hematuria, incontinence, genital sores, muscle weakness, suspicious skin lesions, transient blindness, difficulty walking, depression, unusual weight change, abnormal bleeding, enlarged lymph nodes, angioedema, and breast masses.    Physical Exam  General:  well-developed and overweight-appearing.   Head:  normocephalic and atraumatic.   Eyes:  vision grossly intact, pupils equal, and pupils round.   Ears:  R ear normal and L ear normal.   Nose:  no nasal discharge and external deformity.   Mouth:  pharynx pink and moist and no erythema.   Skin:  red rash on face and chin   Impression & Recommendations:  Problem # 1:  HYPERLIPIDEMIA, MILD (ICD-272.4) add livalo to get goals nearer new guidelines  and due to risk assesment His updated medication list for this problem includes:    Livalo 4 Mg Tabs (Pitavastatin calcium) ..... One by mouth  every friday night before bed  Labs Reviewed: SGOT: 24 (07/01/2010)   SGPT: 23 (07/01/2010)  Lipid Goals: Chol Goal: 200 (07/29/2010)   HDL Goal: 40 (07/29/2010)   LDL Goal: 130 (07/29/2010)   TG Goal: 150 (07/29/2010)  10 Yr Risk Heart Disease: 14 % Prior 10  Yr Risk Heart Disease: 18 % (10/07/2009)   HDL:47.40 (07/01/2010), 58.30 (03/28/2010)  LDL:124 (09/14/2008), 132 (03/17/2008)  Chol:215 (07/01/2010), 213 (03/28/2010)  Trig:84.0 (07/01/2010), 159.0 (03/28/2010)  Problem # 2:  HYPERTENSION (ICD-401.9)  His updated medication list for this problem includes:    Lotensin 20 Mg Tabs (Benazepril hcl) ..... One by mouth daily  BP today: 126/80 Prior BP: 120/70 (04/06/2010)  10 Yr Risk Heart Disease: 14 % Prior 10 Yr Risk Heart Disease: 18 % (10/07/2009)  Labs Reviewed: K+: 4.7 (03/22/2009) Creat: : 1.0 (03/22/2009)   Chol: 215 (07/01/2010)   HDL: 47.40 (07/01/2010)   LDL: 124 (09/14/2008)   TG: 84.0 (07/01/2010)  Problem # 3:  ROSACEA (ICD-695.3) stable  Complete Medication List: 1)  Metrogel 1 % Gel (Metronidazole) .... Apply to face bid 2)  Valium 5 Mg Tabs (Diazepam) .Marland Kitchen.. 1 once daily as needed 3)  Fish Oil 1200 Mg Caps (Omega-3 fatty acids) .... 2 once daily 4)  Lotensin 20 Mg Tabs (Benazepril hcl) .... One by mouth daily 5)  Analpram-hc 1-2.5 % Crea (Hydrocortisone ace-pramoxine) .... Apply bid 6)  Livalo 4  Mg Tabs (Pitavastatin calcium) .... One by mouth  every friday night before bed  Lipid Assessment/Plan:      Based on NCEP/ATP III, the patient's risk factor category is "2 or more risk factors and a calculated 10 year CAD risk of < 20%".  The patient's lipid goals are as follows: Total cholesterol goal is 200; LDL cholesterol goal is 130; HDL cholesterol goal is 40; Triglyceride goal is 150.  His LDL cholesterol goal has been met.    Patient Instructions: 1)  Please schedule a follow-up appointment in 6 months. 2)  Hepatic Panel prior to visit, ICD-9: 955.20 3)  Lipid Panel prior to visit, ICD-9:272.4 Prescriptions: LIVALO 4 MG TABS (PITAVASTATIN CALCIUM) one by mouth daily ( please fill pt has 30 day coupon)  #30 x 0   Entered and Authorized by:   Stacie Glaze MD   Signed by:   Stacie Glaze MD on 07/29/2010   Method  used:   Electronically to        [facility]       [address]       [place], [place]  [address]       Ph: [phone]       Fax: [phone]   RxID:   [id number]    Orders Added: 1)  Est. Patient Level IV [84132]

## 2011-02-14 ENCOUNTER — Encounter: Payer: Self-pay | Admitting: Internal Medicine

## 2011-02-14 ENCOUNTER — Other Ambulatory Visit (INDEPENDENT_AMBULATORY_CARE_PROVIDER_SITE_OTHER): Payer: 59

## 2011-02-14 DIAGNOSIS — T887XXA Unspecified adverse effect of drug or medicament, initial encounter: Secondary | ICD-10-CM

## 2011-02-14 DIAGNOSIS — E785 Hyperlipidemia, unspecified: Secondary | ICD-10-CM

## 2011-02-14 LAB — HEPATIC FUNCTION PANEL
ALT: 22 U/L (ref 0–53)
Albumin: 3.9 g/dL (ref 3.5–5.2)
Bilirubin, Direct: 0.2 mg/dL (ref 0.0–0.3)
Total Protein: 6.4 g/dL (ref 6.0–8.3)

## 2011-02-14 LAB — LIPID PANEL
Cholesterol: 153 mg/dL (ref 0–200)
HDL: 52.2 mg/dL (ref >39.00)
Total CHOL/HDL Ratio: 3
Triglycerides: 108 mg/dL (ref 0.0–149.0)

## 2011-02-21 ENCOUNTER — Encounter: Payer: Self-pay | Admitting: Internal Medicine

## 2011-02-21 ENCOUNTER — Ambulatory Visit (INDEPENDENT_AMBULATORY_CARE_PROVIDER_SITE_OTHER): Payer: 59 | Admitting: Internal Medicine

## 2011-02-21 VITALS — BP 110/70 | HR 72 | Temp 98.5°F | Resp 16 | Ht 68.0 in | Wt 200.0 lb

## 2011-02-21 DIAGNOSIS — I9589 Other hypotension: Secondary | ICD-10-CM

## 2011-02-21 DIAGNOSIS — Z0000 Encounter for general adult medical examination without abnormal findings: Secondary | ICD-10-CM

## 2011-02-21 DIAGNOSIS — I10 Essential (primary) hypertension: Secondary | ICD-10-CM

## 2011-02-21 DIAGNOSIS — I952 Hypotension due to drugs: Secondary | ICD-10-CM

## 2011-02-21 DIAGNOSIS — E785 Hyperlipidemia, unspecified: Secondary | ICD-10-CM

## 2011-02-21 MED ORDER — BENAZEPRIL HCL 20 MG PO TABS: 10.0000 mg | ORAL_TABLET | Freq: Every day | ORAL | Status: DC

## 2011-02-21 NOTE — Progress Notes (Signed)
  Subjective:    Patient ID: John Rowe, male    DOB: Aug 13, 1946, 65 y.o.   MRN: [id number]  HPI  Presents for his yearly physical examination.  He has symptoms of orthostatic hypotension on his current blood pressure regimen stating that when he stands he feels dizzy for a few moments or when he bends over he feels dizzy.  He denies exertional chest pain shortness of breath PND or orthopnea and reports no exertional shortness of breath or chest discomfort.  He notes no swelling of his lower extremities he has had no infectious episodes including no suspicious episodes for myositis   Review of Systems  Constitutional: Negative for fever and fatigue.  HENT: Negative for hearing loss, congestion, neck pain and postnasal drip.   Eyes: Negative for discharge, redness and visual disturbance.  Respiratory: Negative for cough, shortness of breath and wheezing.   Cardiovascular: Negative for leg swelling.  Gastrointestinal: Negative for abdominal pain, constipation and abdominal distention.  Genitourinary: Negative for urgency and frequency.  Musculoskeletal: Negative for joint swelling and arthralgias.  Skin: Negative for color change and rash.  Neurological: Negative for weakness and light-headedness.  Hematological: Negative for adenopathy.  Psychiatric/Behavioral: Negative for behavioral problems.       Objective:   Physical Exam  Constitutional: He is oriented to person, place, and time. He appears well-developed and well-nourished.  HENT:  Head: Normocephalic and atraumatic.  Eyes: Conjunctivae are normal. Pupils are equal, round, and reactive to light.  Neck: Normal range of motion. Neck supple.  Cardiovascular: Normal rate and regular rhythm.   Pulmonary/Chest: Effort normal and breath sounds normal.  Abdominal: Soft. Bowel sounds are normal.  Genitourinary: Prostate normal.  Musculoskeletal: Normal range of motion.  Neurological: He is alert and oriented to person, place,  and time.  Skin: Skin is warm and dry.  Psychiatric: He has a normal mood and affect.          Assessment & Plan:  Excellent results and cholesterol control by using pulse therapy with once a week 4 mg statin He is at all goals for his cholesterol now and will continue this medication regimen.  He has hearing loss and we have referred him [facility] the ideology center [facility].  His blood pressure is low a repeat blood pressure today was 100/68 we will decrease his lisinopril to 10 mg by mouth daily I suspect that the decreased amount of alcohol has resulted in better blood pressure   Patient presents for yearly preventative medicine examination.   all immunizations and health maintenance protocols were reviewed with the patient and they are up to date with these protocols.   screening laboratory values were reviewed with the patient including screening of hyperlipidemia PSA renal function and hepatic function.   There medications past medical history social history problem list and allergies were reviewed in detail.   Goals were established with regard to weight loss exercise diet in compliance with medications

## 2011-05-24 ENCOUNTER — Ambulatory Visit (INDEPENDENT_AMBULATORY_CARE_PROVIDER_SITE_OTHER): Payer: Medicare Other | Admitting: Internal Medicine

## 2011-05-24 ENCOUNTER — Encounter: Payer: Self-pay | Admitting: Internal Medicine

## 2011-05-24 DIAGNOSIS — E756 Lipid storage disorder, unspecified: Secondary | ICD-10-CM

## 2011-05-24 DIAGNOSIS — I10 Essential (primary) hypertension: Secondary | ICD-10-CM

## 2011-05-24 DIAGNOSIS — I951 Orthostatic hypotension: Secondary | ICD-10-CM

## 2011-05-24 NOTE — Progress Notes (Signed)
  Subjective:    Patient ID: John Rowe, male    DOB: 01-Jul-1946, 65 y.o.   MRN: [id number]  HPI Patient was having hypotension and positional hypotension at the count was physical we adjusted his blood pressure medicine by cutting back his ACE inhibitor in half his blood pressure today was 112/78 in the dizziness has largely resolved.  We believe this is was iatrogenic hypotension secondary to medications.  He does have a history of Mnire's disease but I do not believe that the positional dizziness was Mnire's agree some of the residual dizziness comes from the Mnire's.   Review of Systems  Constitutional: Negative for fever and fatigue.  HENT: Negative for hearing loss, congestion, neck pain and postnasal drip.   Eyes: Negative for discharge, redness and visual disturbance.  Respiratory: Negative for cough, shortness of breath and wheezing.   Cardiovascular: Negative for leg swelling.  Gastrointestinal: Negative for abdominal pain, constipation and abdominal distention.  Genitourinary: Negative for urgency and frequency.  Musculoskeletal: Negative for joint swelling and arthralgias.  Skin: Negative for color change and rash.  Neurological: Negative for weakness and light-headedness.  Hematological: Negative for adenopathy.  Psychiatric/Behavioral: Negative for behavioral problems.   Past Medical History  Diagnosis Date  . Chest pain   . Meniere disease   . Hearing loss of left ear   . Hypertension   . Hyperlipidemia    Past Surgical History  Procedure Date  . Colonoscopy     reports that he quit smoking about 31 years ago. He does not have any smokeless tobacco history on file. He reports that he does not drink alcohol or use illicit drugs. family history includes Hypertension in an unspecified family member and Stroke in an unspecified family member.  He is adopted. Allergies  Allergen Reactions  . Oxycodone-Acetaminophen     REACTION: constipation  .  Tetracycline Hcl     REACTION: rash       Objective:   Physical Exam  Vitals reviewed. Constitutional: He appears well-developed and well-nourished.  HENT:  Head: Normocephalic and atraumatic.  Eyes: Conjunctivae are normal. Pupils are equal, round, and reactive to light.  Neck: Normal range of motion. Neck supple.  Cardiovascular: Normal rate and regular rhythm.   Pulmonary/Chest: Effort normal and breath sounds normal.  Abdominal: Soft. Bowel sounds are normal.          Assessment & Plan:  The patient's blood pressure responded to the reduced dose of the ACE at 110/78 he states that he feels much better with less dizziness or symptoms consistent with his hypo-tension.  He is no longer orthostatic he feels well he's been able to exercise and care for his grandkids.  He states it is Mnire's disease has not been active in that he can differentiate between the dizziness associated with the Mnire's disease and the hypotension associated with the iatrogenic hypotension.  He is stable his current medications we reviewed his dose corrected his med list and spent 30 minutes space there is counseling him about monitoring his blood pressure and bringing blood pressure readings back to our office for the return office visit.

## 2011-08-08 ENCOUNTER — Other Ambulatory Visit: Payer: Self-pay | Admitting: Internal Medicine

## 2012-02-20 ENCOUNTER — Other Ambulatory Visit: Payer: Self-pay | Admitting: Gastroenterology

## 2012-02-20 DIAGNOSIS — Z0000 Encounter for general adult medical examination without abnormal findings: Secondary | ICD-10-CM

## 2012-02-21 ENCOUNTER — Other Ambulatory Visit (INDEPENDENT_AMBULATORY_CARE_PROVIDER_SITE_OTHER): Payer: BC Managed Care – PPO

## 2012-02-21 DIAGNOSIS — Z125 Encounter for screening for malignant neoplasm of prostate: Secondary | ICD-10-CM

## 2012-02-21 DIAGNOSIS — E785 Hyperlipidemia, unspecified: Secondary | ICD-10-CM

## 2012-02-21 DIAGNOSIS — I10 Essential (primary) hypertension: Secondary | ICD-10-CM

## 2012-02-21 DIAGNOSIS — Z0000 Encounter for general adult medical examination without abnormal findings: Secondary | ICD-10-CM

## 2012-02-21 LAB — CBC WITH DIFFERENTIAL/PLATELET
Basophils Relative: 0.7 % (ref 0.0–3.0)
Eosinophils Relative: 2.8 % (ref 0.0–5.0)
Hemoglobin: 13.9 g/dL (ref 13.0–17.0)
Lymphocytes Relative: 36.5 % (ref 12.0–46.0)
MCV: 98.1 fl (ref 78.0–100.0)
Monocytes Absolute: 0.7 10*3/uL (ref 0.1–1.0)
Neutro Abs: 4.3 10*3/uL (ref 1.4–7.7)
Neutrophils Relative %: 51.7 % (ref 43.0–77.0)
RBC: 4.31 Mil/uL (ref 4.22–5.81)
WBC: 8.4 10*3/uL (ref 4.5–10.5)

## 2012-02-21 LAB — BASIC METABOLIC PANEL
CO2: 27 mEq/L (ref 19–32)
Calcium: 8.8 mg/dL (ref 8.4–10.5)
Glucose, Bld: 86 mg/dL (ref 70–99)
Potassium: 4.1 mEq/L (ref 3.5–5.1)
Sodium: 138 mEq/L (ref 135–145)

## 2012-02-21 LAB — HEPATIC FUNCTION PANEL
AST: 23 U/L (ref 0–37)
Albumin: 3.9 g/dL (ref 3.5–5.2)
Total Bilirubin: 0.5 mg/dL (ref 0.3–1.2)

## 2012-02-21 LAB — LIPID PANEL
HDL: 50.2 mg/dL (ref >39.00)
LDL Cholesterol: 111 mg/dL — ABNORMAL HIGH (ref 0–99)
Total CHOL/HDL Ratio: 4
Triglycerides: 84 mg/dL (ref 0.0–149.0)
VLDL: 16.8 mg/dL (ref 0.0–40.0)

## 2012-02-21 LAB — POCT URINALYSIS DIPSTICK
Bilirubin, UA: NEGATIVE
Ketones, UA: NEGATIVE
Leukocytes, UA: NEGATIVE
Spec Grav, UA: 1.02

## 2012-02-28 ENCOUNTER — Ambulatory Visit (INDEPENDENT_AMBULATORY_CARE_PROVIDER_SITE_OTHER): Payer: Medicare Other | Admitting: Internal Medicine

## 2012-02-28 ENCOUNTER — Encounter: Payer: Self-pay | Admitting: Internal Medicine

## 2012-02-28 VITALS — BP 120/78 | HR 72 | Temp 98.6°F | Resp 16 | Ht 68.0 in | Wt 199.0 lb

## 2012-02-28 DIAGNOSIS — Z0000 Encounter for general adult medical examination without abnormal findings: Secondary | ICD-10-CM

## 2012-02-28 DIAGNOSIS — I10 Essential (primary) hypertension: Secondary | ICD-10-CM

## 2012-02-28 DIAGNOSIS — E785 Hyperlipidemia, unspecified: Secondary | ICD-10-CM

## 2012-02-28 DIAGNOSIS — H8109 Meniere's disease, unspecified ear: Secondary | ICD-10-CM

## 2012-02-28 NOTE — Progress Notes (Signed)
Subjective:    Patient ID: John Rowe, male    DOB: May 27, 1946, 66 y.o.   MRN: [id number]  HPI  Patient is a 66 year old male who presents for his yearly annual examination screening blood work was done in advance.  His also followed for chronic health maintenance of the following problems mild to moderate hyperlipidemia on pulse therapy history of Mnire's disease with hearing loss primarily in the left ear a history of controlled hypertension.  A history of moderate obesity.  Review of Systems  Constitutional: Negative for fever and fatigue.  HENT: Negative for hearing loss, congestion, neck pain and postnasal drip.   Eyes: Negative for discharge, redness and visual disturbance.  Respiratory: Negative for cough, shortness of breath and wheezing.   Cardiovascular: Negative for leg swelling.  Gastrointestinal: Negative for abdominal pain, constipation and abdominal distention.  Genitourinary: Negative for urgency and frequency.  Musculoskeletal: Negative for joint swelling and arthralgias.  Skin: Negative for color change and rash.  Neurological: Negative for weakness and light-headedness.  Hematological: Negative for adenopathy.  Psychiatric/Behavioral: Negative for behavioral problems.   Past Medical History  Diagnosis Date  . Chest pain   . Meniere disease   . Hearing loss of left ear   . Hypertension   . Hyperlipidemia     History   Social History  . Marital Status: Married    Spouse Name: N/A    Number of Children: N/A  . Years of Education: N/A   Occupational History  . retired    Social History Main Topics  . Smoking status: Former Smoker    Quit date: 09/12/1979  . Smokeless tobacco: Not on file  . Alcohol Use: No  . Drug Use: No  . Sexually Active: Not on file   Other Topics Concern  . Not on file   Social History Narrative  . No narrative on file    Past Surgical History  Procedure Date  . Colonoscopy     Family History  Problem  Relation Age of Onset  . Adopted: Yes  . Hypertension    . Stroke      Allergies  Allergen Reactions  . Oxycodone-Acetaminophen     REACTION: constipation  . Tetracycline Hcl     REACTION: rash    Current Outpatient Prescriptions on File Prior to Visit  Medication Sig Dispense Refill  . diazepam (VALIUM) 5 MG tablet Take 5 mg by mouth every 6 (six) hours as needed.        . metroNIDAZOLE (METROGEL) 1 % gel Apply topically 2 (two) times daily.        . Pitavastatin Calcium (LIVALO) 4 MG TABS Take by mouth once a week.        Marland Kitchen DISCONTD: benazepril (LOTENSIN) 20 MG tablet TAKE ONE TABLET BY MOUTH ONE TIME DAILY  90 tablet  2    BP 120/78  Pulse 72  Temp 98.6 F (37 C)  Resp 16  Ht 5\' 8"  (1.727 m)  Wt 199 lb (90.266 kg)  BMI 30.26 kg/m2       Objective:   Physical Exam  Nursing note and vitals reviewed. Constitutional: He is oriented to person, place, and time. He appears well-developed and well-nourished.  HENT:  Head: Normocephalic and atraumatic.  Eyes: Conjunctivae are normal. Pupils are equal, round, and reactive to light.  Neck: Normal range of motion. Neck supple.  Cardiovascular: Normal rate and regular rhythm.   Pulmonary/Chest: Effort normal and breath sounds normal.  Abdominal: Soft. Bowel  sounds are normal.  Genitourinary: Rectum normal and prostate normal.  Musculoskeletal: Normal range of motion.  Neurological: He is alert and oriented to person, place, and time.  Skin: Skin is warm and dry.  Psychiatric: He has a normal mood and affect. His behavior is normal.          Assessment & Plan:   Patient presents for yearly preventative medicine examination.   all immunizations and health maintenance protocols were reviewed with the patient and they are up to date with these protocols.   screening laboratory values were reviewed with the patient including screening of hyperlipidemia PSA renal function and hepatic function.   There medications past  medical history social history problem list and allergies were reviewed in detail.   Goals were established with regard to weight loss exercise diet in compliance with medications  Patient's cholesterol control has somewhat worsened this is due to inactivity and diet changes he is now caring for his grandkids and a daily basis and sometimes diet choices as well as his inability to have aerobic exercise has affected his LDL level.  PSA is stable blood pressure stable Mnire's disease is progressive with hearing loss

## 2012-08-29 ENCOUNTER — Other Ambulatory Visit (INDEPENDENT_AMBULATORY_CARE_PROVIDER_SITE_OTHER): Payer: Medicare Other

## 2012-08-29 DIAGNOSIS — E785 Hyperlipidemia, unspecified: Secondary | ICD-10-CM

## 2012-08-29 LAB — LIPID PANEL
Cholesterol: 182 mg/dL (ref 0–200)
Triglycerides: 146 mg/dL (ref 0.0–149.0)

## 2012-08-29 LAB — HEPATIC FUNCTION PANEL
ALT: 22 U/L (ref 0–53)
AST: 25 U/L (ref 0–37)
Albumin: 4.1 g/dL (ref 3.5–5.2)
Total Bilirubin: 1.1 mg/dL (ref 0.3–1.2)
Total Protein: 7.4 g/dL (ref 6.0–8.3)

## 2012-12-24 ENCOUNTER — Other Ambulatory Visit: Payer: Self-pay | Admitting: Internal Medicine

## 2013-02-24 ENCOUNTER — Other Ambulatory Visit (INDEPENDENT_AMBULATORY_CARE_PROVIDER_SITE_OTHER): Payer: Medicare Other

## 2013-02-24 DIAGNOSIS — I10 Essential (primary) hypertension: Secondary | ICD-10-CM

## 2013-02-24 DIAGNOSIS — E785 Hyperlipidemia, unspecified: Secondary | ICD-10-CM

## 2013-02-24 DIAGNOSIS — Z0000 Encounter for general adult medical examination without abnormal findings: Secondary | ICD-10-CM

## 2013-02-24 LAB — POCT URINALYSIS DIPSTICK
Glucose, UA: NEGATIVE
Leukocytes, UA: NEGATIVE
Nitrite, UA: NEGATIVE
Protein, UA: NEGATIVE
Urobilinogen, UA: 0.2

## 2013-02-24 LAB — CBC WITH DIFFERENTIAL/PLATELET
Eosinophils Absolute: 0.3 10*3/uL (ref 0.0–0.7)
Eosinophils Relative: 3.2 % (ref 0.0–5.0)
HCT: 42.9 % (ref 39.0–52.0)
Lymphs Abs: 3.3 10*3/uL (ref 0.7–4.0)
MCHC: 33.6 g/dL (ref 30.0–36.0)
MCV: 98 fl (ref 78.0–100.0)
Monocytes Absolute: 0.8 10*3/uL (ref 0.1–1.0)
Neutrophils Relative %: 50.6 % (ref 43.0–77.0)
Platelets: 173 10*3/uL (ref 150.0–400.0)
WBC: 9 10*3/uL (ref 4.5–10.5)

## 2013-02-24 LAB — HEPATIC FUNCTION PANEL
ALT: 17 U/L (ref 0–53)
Bilirubin, Direct: 0.1 mg/dL (ref 0.0–0.3)
Total Bilirubin: 0.6 mg/dL (ref 0.3–1.2)
Total Protein: 6.5 g/dL (ref 6.0–8.3)

## 2013-02-24 LAB — BASIC METABOLIC PANEL
BUN: 15 mg/dL (ref 6–23)
CO2: 30 mEq/L (ref 19–32)
Chloride: 104 mEq/L (ref 96–112)
Creatinine, Ser: 1.1 mg/dL (ref 0.4–1.5)
Glucose, Bld: 87 mg/dL (ref 70–99)
Potassium: 4.4 mEq/L (ref 3.5–5.1)

## 2013-02-24 LAB — LIPID PANEL
Cholesterol: 162 mg/dL (ref 0–200)
LDL Cholesterol: 99 mg/dL (ref 0–99)
Triglycerides: 71 mg/dL (ref 0.0–149.0)

## 2013-02-24 LAB — PSA: PSA: 0.34 ng/mL (ref 0.10–4.00)

## 2013-02-24 LAB — TSH: TSH: 0.92 u[IU]/mL (ref 0.35–5.50)

## 2013-03-03 ENCOUNTER — Encounter: Payer: Self-pay | Admitting: Internal Medicine

## 2013-03-03 ENCOUNTER — Ambulatory Visit (INDEPENDENT_AMBULATORY_CARE_PROVIDER_SITE_OTHER): Payer: Medicare Other | Admitting: Internal Medicine

## 2013-03-03 VITALS — BP 124/80 | HR 68 | Temp 98.2°F | Resp 16 | Ht 68.0 in | Wt 199.0 lb

## 2013-03-03 DIAGNOSIS — I10 Essential (primary) hypertension: Secondary | ICD-10-CM

## 2013-03-03 DIAGNOSIS — E785 Hyperlipidemia, unspecified: Secondary | ICD-10-CM

## 2013-03-03 DIAGNOSIS — Z0000 Encounter for general adult medical examination without abnormal findings: Secondary | ICD-10-CM

## 2013-03-03 NOTE — Progress Notes (Signed)
  Subjective:    Patient ID: John Rowe, male    DOB: 08/09/1946, 67 y.o.   MRN: [id number]  HPI    Review of Systems  Constitutional: Negative for fever and fatigue.  HENT: Negative for hearing loss, congestion, neck pain and postnasal drip.   Eyes: Negative for discharge, redness and visual disturbance.  Respiratory: Negative for cough, shortness of breath and wheezing.   Cardiovascular: Negative for leg swelling.  Gastrointestinal: Negative for abdominal pain, constipation and abdominal distention.  Genitourinary: Negative for urgency and frequency.  Musculoskeletal: Negative for joint swelling and arthralgias.  Skin: Negative for color change and rash.  Neurological: Negative for weakness and light-headedness.  Hematological: Negative for adenopathy.  Psychiatric/Behavioral: Negative for behavioral problems.       Objective:   Physical Exam  Vitals reviewed. Constitutional: He is oriented to person, place, and time. He appears well-developed and well-nourished.  HENT:  Head: Normocephalic and atraumatic.  Eyes: Conjunctivae are normal. Pupils are equal, round, and reactive to light.  Neck: Normal range of motion. Neck supple.  Cardiovascular: Normal rate and regular rhythm.   Murmur heard. Pulmonary/Chest: Effort normal and breath sounds normal.  Abdominal: Soft. Bowel sounds are normal.  Musculoskeletal: He exhibits edema.  Neurological: He is alert and oriented to person, place, and time.  Skin: Skin is warm and dry.          Assessment & Plan:   Patient presents for yearly preventative medicine examination.   all immunizations and health maintenance protocols were reviewed with the patient and they are up to date with these protocols.   screening laboratory values were reviewed with the patient including screening of hyperlipidemia PSA renal function and hepatic function.   There medications past medical history social history problem list and allergies  were reviewed in detail.   Goals were established with regard to weight loss exercise diet in compliance with medications

## 2013-03-03 NOTE — Patient Instructions (Signed)
The patient is instructed to continue all medications as prescribed. Schedule followup with check out clerk upon leaving the clinic  

## 2013-03-03 NOTE — Progress Notes (Signed)
Subjective:    Patient ID: John Rowe, male    DOB: [birth year], 67 y.o.   MRN: [id number]  HPI Wellness    Review of Systems  Constitutional: Negative for fever and fatigue.  HENT: Negative for hearing loss, congestion, neck pain and postnasal drip.   Eyes: Negative for discharge, redness and visual disturbance.  Respiratory: Negative for cough, shortness of breath and wheezing.   Cardiovascular: Negative for leg swelling.  Gastrointestinal: Negative for abdominal pain, constipation and abdominal distention.  Genitourinary: Negative for urgency and frequency.  Musculoskeletal: Negative for joint swelling and arthralgias.  Skin: Negative for color change and rash.  Neurological: Negative for weakness and light-headedness.  Hematological: Negative for adenopathy.  Psychiatric/Behavioral: Negative for behavioral problems.   Past Medical History  Diagnosis Date  . Chest pain   . Meniere disease   . Hearing loss of left ear   . Hypertension   . Hyperlipidemia     History   Social History  . Marital Status: Married    Spouse Name: N/A    Number of Children: N/A  . Years of Education: N/A   Occupational History  . retired    Social History Main Topics  . Smoking status: Former Smoker    Quit date: 09/12/1979  . Smokeless tobacco: Not on file  . Alcohol Use: No  . Drug Use: No  . Sexually Active: Not on file   Other Topics Concern  . Not on file   Social History Narrative  . No narrative on file    Past Surgical History  Procedure Laterality Date  . Colonoscopy      Family History  Problem Relation Age of Onset  . Adopted: Yes  . Hypertension    . Stroke      Allergies  Allergen Reactions  . Oxycodone-Acetaminophen     REACTION: constipation  . Tetracycline Hcl     REACTION: rash    Current Outpatient Prescriptions on File Prior to Visit  Medication Sig Dispense Refill  . benazepril (LOTENSIN) 20 MG tablet Take 10 mg by mouth daily.       .  diazepam (VALIUM) 5 MG tablet Take 5 mg by mouth every 6 (six) hours as needed.        . Pitavastatin Calcium (LIVALO) 4 MG TABS Take by mouth once a week.         No current facility-administered medications on file prior to visit.    BP 124/80  Pulse 68  Temp(Src) 98.2 F (36.8 C)  Resp 16  Ht 5\' 8"  (1.727 m)  Wt 199 lb (90.266 kg)  BMI 30.26 kg/m2        Objective:   Physical Exam  Nursing note and vitals reviewed. Constitutional: He is oriented to person, place, and time. He appears well-developed and well-nourished.  HENT:  Head: Normocephalic and atraumatic.  Eyes: Conjunctivae are normal. Pupils are equal, round, and reactive to light.  Neck: Normal range of motion. Neck supple.  Cardiovascular: Normal rate and regular rhythm.   Pulmonary/Chest: Effort normal and breath sounds normal.  Abdominal: Soft. Bowel sounds are normal.  Musculoskeletal: He exhibits edema and tenderness.  Neurological: He is alert and oriented to person, place, and time.  Skin: Skin is warm and dry.          Assessment & Plan:   Patient presents for yearly preventative medicine examination.   all immunizations and health maintenance protocols were reviewed with the patient and they are up  to date with these protocols.   screening laboratory values were reviewed with the patient including screening of hyperlipidemia PSA renal function and hepatic function.   There medications past medical history social history problem list and allergies were reviewed in detail.   Goals were established with regard to weight loss exercise diet in compliance with medications   Stable HTN Lipids on livalo

## 2013-08-25 ENCOUNTER — Ambulatory Visit (INDEPENDENT_AMBULATORY_CARE_PROVIDER_SITE_OTHER): Payer: Medicare Other | Admitting: Internal Medicine

## 2013-08-25 ENCOUNTER — Encounter: Payer: Self-pay | Admitting: Internal Medicine

## 2013-08-25 VITALS — BP 130/80 | HR 76 | Temp 98.2°F | Resp 16 | Ht 68.0 in | Wt 198.0 lb

## 2013-08-25 DIAGNOSIS — E7889 Other lipoprotein metabolism disorders: Secondary | ICD-10-CM

## 2013-08-25 DIAGNOSIS — E789 Disorder of lipoprotein metabolism, unspecified: Secondary | ICD-10-CM

## 2013-08-25 DIAGNOSIS — I10 Essential (primary) hypertension: Secondary | ICD-10-CM

## 2013-08-25 NOTE — Progress Notes (Signed)
Pre visit review using our clinic review tool, if applicable. No additional management support is needed unless otherwise documented below in the visit note. 

## 2013-08-25 NOTE — Progress Notes (Signed)
   Subjective:    Patient ID: John Rowe, male    DOB: 1946-07-12, 67 y.o.   MRN: [id number]  HPI HTN and hyperlipidemia follow up CPX in June Lipids at goal Taking the Livalo on Friday nights weight stable Blood pressure stable    Review of Systems  Constitutional: Negative for fever and fatigue.  HENT: Negative for congestion, hearing loss and postnasal drip.   Eyes: Negative for discharge, redness and visual disturbance.  Respiratory: Negative for cough, shortness of breath and wheezing.   Cardiovascular: Negative for leg swelling.  Gastrointestinal: Negative for abdominal pain, constipation and abdominal distention.  Genitourinary: Negative for urgency and frequency.  Musculoskeletal: Negative for arthralgias, joint swelling and neck pain.  Skin: Negative for color change and rash.  Neurological: Negative for weakness and light-headedness.  Hematological: Negative for adenopathy.  Psychiatric/Behavioral: Negative for behavioral problems.   Past Medical History  Diagnosis Date  . Chest pain   . Meniere disease   . Hearing loss of left ear   . Hypertension   . Hyperlipidemia     History   Social History  . Marital Status: Married    Spouse Name: N/A    Number of Children: N/A  . Years of Education: N/A   Occupational History  . retired    Social History Main Topics  . Smoking status: Former Smoker    Quit date: 09/12/1979  . Smokeless tobacco: Not on file  . Alcohol Use: No  . Drug Use: No  . Sexual Activity: Not on file   Other Topics Concern  . Not on file   Social History Narrative  . No narrative on file    Past Surgical History  Procedure Laterality Date  . Colonoscopy      Family History  Problem Relation Age of Onset  . Adopted: Yes  . Hypertension    . Stroke      Allergies  Allergen Reactions  . Oxycodone-Acetaminophen     REACTION: constipation  . Tetracycline Hcl     REACTION: rash    Current Outpatient Prescriptions  on File Prior to Visit  Medication Sig Dispense Refill  . benazepril (LOTENSIN) 20 MG tablet Take 10 mg by mouth daily.       . diazepam (VALIUM) 5 MG tablet Take 5 mg by mouth every 6 (six) hours as needed.        . Pitavastatin Calcium (LIVALO) 4 MG TABS Take by mouth once a week.         No current facility-administered medications on file prior to visit.    BP 130/80  Pulse 76  Temp(Src) 98.2 F (36.8 C)  Resp 16  Ht 5\' 8"  (1.727 m)  Wt 198 lb (89.812 kg)  BMI 30.11 kg/m2       Objective:   Physical Exam  Vitals reviewed. Constitutional: He appears well-developed and well-nourished.  HENT:  Head: Normocephalic and atraumatic.  Eyes: Conjunctivae are normal. Pupils are equal, round, and reactive to light.  Neck: Normal range of motion. Neck supple.  Cardiovascular: Normal rate and regular rhythm.   Pulmonary/Chest: Effort normal and breath sounds normal.  Abdominal: Soft. Bowel sounds are normal.          Assessment & Plan:  Discussion of mobility and need to continue activity 10,000 steps Blood pressure stable Lipids at goal needes refill Lab monitoring up to day CPX in June No dizzyness reported

## 2013-09-01 ENCOUNTER — Ambulatory Visit: Payer: Medicare Other | Admitting: Internal Medicine

## 2013-09-04 ENCOUNTER — Emergency Department (HOSPITAL_COMMUNITY)
Admission: EM | Admit: 2013-09-04 | Discharge: 2013-09-04 | Disposition: A | Discharge status: Home / Self Care | Payer: Medicare Other | Attending: Emergency Medicine | Admitting: Emergency Medicine

## 2013-09-04 ENCOUNTER — Encounter (HOSPITAL_COMMUNITY): Payer: Self-pay | Admitting: Emergency Medicine

## 2013-09-04 DIAGNOSIS — E785 Hyperlipidemia, unspecified: Secondary | ICD-10-CM | POA: Insufficient documentation

## 2013-09-04 DIAGNOSIS — R58 Hemorrhage, not elsewhere classified: Secondary | ICD-10-CM

## 2013-09-04 DIAGNOSIS — Z79899 Other long term (current) drug therapy: Secondary | ICD-10-CM | POA: Insufficient documentation

## 2013-09-04 DIAGNOSIS — Z8669 Personal history of other diseases of the nervous system and sense organs: Secondary | ICD-10-CM | POA: Insufficient documentation

## 2013-09-04 DIAGNOSIS — N501 Vascular disorders of male genital organs: Secondary | ICD-10-CM | POA: Insufficient documentation

## 2013-09-04 DIAGNOSIS — H9190 Unspecified hearing loss, unspecified ear: Secondary | ICD-10-CM | POA: Insufficient documentation

## 2013-09-04 DIAGNOSIS — Z87891 Personal history of nicotine dependence: Secondary | ICD-10-CM | POA: Insufficient documentation

## 2013-09-04 DIAGNOSIS — I10 Essential (primary) hypertension: Secondary | ICD-10-CM | POA: Insufficient documentation

## 2013-09-04 NOTE — ED Provider Notes (Signed)
CSN: [id number]     Arrival date & time 09/04/13  2108 History   First MD Initiated Contact with Patient 09/04/13 2134     Chief Complaint  Patient presents with  . Bleeding/Bruising   (Consider location/radiation/quality/duration/timing/severity/associated sxs/prior Treatment) HPI Comments: Patient presents to the ED with a chief complaint bleeding from scrotum.  Onset of symptoms was tonight after the patient stepped over something in a lunging motion.  He states that the bleeding soaked through his pants.  He states that this has happened once before, but the bleeding stopped with simple wound care.  Patient denies other associated health problems.  He is not taking any blood thinners.  The bleeding has since stopped.  The history is provided by the patient. No language interpreter was used.    Past Medical History  Diagnosis Date  . Chest pain   . Meniere disease   . Hearing loss of left ear   . Hypertension   . Hyperlipidemia    Past Surgical History  Procedure Laterality Date  . Colonoscopy     Family History  Problem Relation Age of Onset  . Adopted: Yes  . Hypertension    . Stroke     History  Substance Use Topics  . Smoking status: Former Smoker    Quit date: 09/12/1979  . Smokeless tobacco: Not on file  . Alcohol Use: Yes    Review of Systems  All other systems reviewed and are negative.    Allergies  Oxycodone-acetaminophen and Tetracycline hcl  Home Medications   Current Outpatient Rx  Name  Route  Sig  Dispense  Refill  . benazepril (LOTENSIN) 20 MG tablet   Oral   Take 10 mg by mouth daily.          . Pitavastatin Calcium (LIVALO) 4 MG TABS   Oral   Take 4 mg by mouth every Friday.           BP 162/79  Pulse 97  Temp(Src) 97.8 F (36.6 C) (Oral)  Resp 16  Ht 5\' 8"  (1.727 m)  Wt 195 lb (88.451 kg)  BMI 29.66 kg/m2  SpO2 97% Physical Exam  Nursing note and vitals reviewed. Constitutional: He is oriented to person, place, and  time. He appears well-developed and well-nourished.  HENT:  Head: Normocephalic and atraumatic.  Eyes: Conjunctivae and EOM are normal.  Neck: Normal range of motion.  Cardiovascular: Normal rate.   Pulmonary/Chest: Effort normal.  Abdominal: He exhibits no distension.  Genitourinary:  Normal genitalia, no hernias, masses, or abnormalities  Musculoskeletal: Normal range of motion.  Neurological: He is alert and oriented to person, place, and time.  Skin: Skin is dry.  Scrotum is without noticeable injury, it looks like a small vessel was bleeding, but there is no laceration or other sign of injury  Psychiatric: He has a normal mood and affect. His behavior is normal. Judgment and thought content normal.    ED Course  Procedures (including critical care time) Labs Review Labs Reviewed - No data to display Imaging Review No results found.  EKG Interpretation   None       MDM   1. Bleeding    Patient with bleeding scrotum.  Bleeding has stopped.  No wound.  Looks like bleeding was from a hair follicle.  Cleaned and dressed the site.  No reoccurrence of bleeding.  Discharge to home.  PCP follow-up.  Discussed the patient with Dr. Freida Busman, who agrees with the plan.  Roxy Horseman, PA-C 09/04/13 2232

## 2013-09-04 NOTE — ED Provider Notes (Signed)
EKG Interpretation   None        Tc [place] T Doralene Glanz, MD 09/04/13 2309 

## 2013-09-04 NOTE — ED Notes (Signed)
Pt arrived to the ED with a complaint of bleeding from the left side of the mans scrotum.  Pt states that bleeding was profuse but in triage the bleeding has stopped.  Pt exhibits as if he has a vein on the scrotum that is the source of the bleeding.

## 2013-12-08 ENCOUNTER — Telehealth: Payer: Self-pay | Admitting: Internal Medicine

## 2013-12-08 NOTE — Telephone Encounter (Signed)
Ok per Dr Arnoldo Morale.  Letter ready for pick up and patient is aware

## 2013-12-08 NOTE — Telephone Encounter (Signed)
[facility] and needs a letter stating he has [place] and what treatments he has been giving for it, with dates included.

## 2014-02-07 ENCOUNTER — Other Ambulatory Visit: Payer: Self-pay | Admitting: Internal Medicine

## 2014-03-03 ENCOUNTER — Other Ambulatory Visit: Payer: Self-pay | Admitting: Internal Medicine

## 2014-03-06 ENCOUNTER — Encounter: Payer: Self-pay | Admitting: *Deleted

## 2014-03-06 ENCOUNTER — Other Ambulatory Visit (INDEPENDENT_AMBULATORY_CARE_PROVIDER_SITE_OTHER): Payer: Medicare Other

## 2014-03-06 DIAGNOSIS — Z125 Encounter for screening for malignant neoplasm of prostate: Secondary | ICD-10-CM

## 2014-03-06 DIAGNOSIS — E785 Hyperlipidemia, unspecified: Secondary | ICD-10-CM

## 2014-03-06 DIAGNOSIS — I10 Essential (primary) hypertension: Secondary | ICD-10-CM

## 2014-03-06 DIAGNOSIS — Z0000 Encounter for general adult medical examination without abnormal findings: Secondary | ICD-10-CM

## 2014-03-06 LAB — LIPID PANEL
CHOLESTEROL: 191 mg/dL (ref 0–200)
HDL: 47 mg/dL (ref >39.00)
LDL CALC: 88 mg/dL (ref 0–99)
NonHDL: 144
TRIGLYCERIDES: 281 mg/dL — AB (ref 0.0–149.0)
Total CHOL/HDL Ratio: 4
VLDL: 56.2 mg/dL — AB (ref 0.0–40.0)

## 2014-03-06 LAB — BASIC METABOLIC PANEL
BUN: 17 mg/dL (ref 6–23)
CALCIUM: 9 mg/dL (ref 8.4–10.5)
CO2: 26 mEq/L (ref 19–32)
Chloride: 105 mEq/L (ref 96–112)
Creatinine, Ser: 1 mg/dL (ref 0.4–1.5)
GFR: 74.95 mL/min (ref >60.00)
Glucose, Bld: 91 mg/dL (ref 70–99)
Potassium: 3.8 mEq/L (ref 3.5–5.1)
SODIUM: 139 meq/L (ref 135–145)

## 2014-03-06 LAB — CBC WITH DIFFERENTIAL/PLATELET
BASOS ABS: 0 10*3/uL (ref 0.0–0.1)
Basophils Relative: 0.4 % (ref 0.0–3.0)
Eosinophils Absolute: 0.3 10*3/uL (ref 0.0–0.7)
Eosinophils Relative: 3.1 % (ref 0.0–5.0)
HCT: 42 % (ref 39.0–52.0)
HEMOGLOBIN: 14.3 g/dL (ref 13.0–17.0)
LYMPHS PCT: 31.4 % (ref 12.0–46.0)
Lymphs Abs: 3.1 10*3/uL (ref 0.7–4.0)
MCHC: 34.1 g/dL (ref 30.0–36.0)
MCV: 97.4 fl (ref 78.0–100.0)
MONOS PCT: 8.5 % (ref 3.0–12.0)
Monocytes Absolute: 0.8 10*3/uL (ref 0.1–1.0)
NEUTROS ABS: 5.7 10*3/uL (ref 1.4–7.7)
Neutrophils Relative %: 56.6 % (ref 43.0–77.0)
Platelets: 186 10*3/uL (ref 150.0–400.0)
RBC: 4.31 Mil/uL (ref 4.22–5.81)
RDW: 13.6 % (ref 11.5–15.5)
WBC: 10 10*3/uL (ref 4.0–10.5)

## 2014-03-06 LAB — POCT URINALYSIS DIPSTICK
BILIRUBIN UA: NEGATIVE
Blood, UA: NEGATIVE
GLUCOSE UA: NEGATIVE
KETONES UA: NEGATIVE
Leukocytes, UA: NEGATIVE
Nitrite, UA: NEGATIVE
PROTEIN UA: NEGATIVE
Urobilinogen, UA: 0.2
pH, UA: 5.5

## 2014-03-06 LAB — HEPATIC FUNCTION PANEL
ALBUMIN: 4 g/dL (ref 3.5–5.2)
ALK PHOS: 67 U/L (ref 39–117)
ALT: 18 U/L (ref 0–53)
AST: 22 U/L (ref 0–37)
BILIRUBIN DIRECT: 0.1 mg/dL (ref 0.0–0.3)
Total Bilirubin: 0.5 mg/dL (ref 0.2–1.2)
Total Protein: 6.7 g/dL (ref 6.0–8.3)

## 2014-03-06 LAB — PSA: PSA: 0.25 ng/mL (ref 0.10–4.00)

## 2014-03-06 LAB — TSH: TSH: 1.05 u[IU]/mL (ref 0.35–4.50)

## 2014-03-09 ENCOUNTER — Telehealth: Payer: Self-pay | Admitting: Internal Medicine

## 2014-03-09 NOTE — Telephone Encounter (Signed)
Pt would like to go to [facility] for pcp since he lives on [address].  Would like your expert advice on who will accept him.  [facility] is a little tricky since no one is accepting w/out referral. Thanks again dr Arnoldo Morale.  Pt had his labs last week anyway.

## 2014-03-10 NOTE — Telephone Encounter (Signed)
Dr. Jake Bathe per Dr Arnoldo Morale

## 2014-03-17 NOTE — Telephone Encounter (Signed)
John Rowe pt lives on [address] and where is  dr Jake Bathe located at and  please provide phone #

## 2014-03-18 ENCOUNTER — Encounter: Payer: Medicare Other | Admitting: Internal Medicine

## 2014-03-20 NOTE — Telephone Encounter (Signed)
Dr Tawana Scale is new internist at our [place] office  [phone]

## 2014-03-23 ENCOUNTER — Encounter: Payer: Self-pay | Admitting: Internal Medicine

## 2014-03-23 ENCOUNTER — Ambulatory Visit (INDEPENDENT_AMBULATORY_CARE_PROVIDER_SITE_OTHER): Payer: Medicare Other | Admitting: Internal Medicine

## 2014-03-23 VITALS — BP 130/82 | HR 80 | Temp 97.6°F | Resp 16 | Ht 68.0 in | Wt 202.0 lb

## 2014-03-23 DIAGNOSIS — H8109 Meniere's disease, unspecified ear: Secondary | ICD-10-CM

## 2014-03-23 DIAGNOSIS — E785 Hyperlipidemia, unspecified: Secondary | ICD-10-CM

## 2014-03-23 DIAGNOSIS — I10 Essential (primary) hypertension: Secondary | ICD-10-CM

## 2014-03-23 NOTE — Assessment & Plan Note (Signed)
Continue with current prescription therapy as reflected on the Med list.  

## 2014-03-23 NOTE — Progress Notes (Deleted)
Pre visit review using our clinic review tool, if applicable. No additional management support is needed unless otherwise documented below in the visit note. 

## 2014-03-23 NOTE — Progress Notes (Signed)
   Subjective:    HPI  New pt - switching from Dr Arnoldo Morale  The patient presents for a follow-up of  chronic hypertension, chronic dyslipidemia, Menier's controlled with medicines   BP Readings from Last 3 Encounters:  03/23/14 130/82  09/04/13 127/80  08/25/13 130/80      Review of Systems  Constitutional: Negative for appetite change, fatigue and unexpected weight change.  HENT: Negative for congestion, nosebleeds, sneezing, sore throat and trouble swallowing.   Eyes: Negative for itching and visual disturbance.  Respiratory: Negative for cough.   Cardiovascular: Negative for chest pain, palpitations and leg swelling.  Gastrointestinal: Negative for nausea, diarrhea, blood in stool and abdominal distention.  Genitourinary: Negative for frequency and hematuria.  Musculoskeletal: Negative for back pain, gait problem, joint swelling and neck pain.  Skin: Negative for rash.  Neurological: Negative for dizziness, tremors, speech difficulty and weakness.  Psychiatric/Behavioral: Negative for suicidal ideas, sleep disturbance, dysphoric mood and agitation. The patient is not nervous/anxious.        Objective:   Physical Exam  Constitutional: He is oriented to person, place, and time. He appears well-developed. No distress.  NAD  HENT:  Mouth/Throat: Oropharynx is clear and moist.  Eyes: Conjunctivae are normal. Pupils are equal, round, and reactive to light.  Neck: Normal range of motion. No JVD present. No thyromegaly present.  Cardiovascular: Normal rate, regular rhythm, normal heart sounds and intact distal pulses.  Exam reveals no gallop and no friction rub.   No murmur heard. Pulmonary/Chest: Effort normal and breath sounds normal. No respiratory distress. He has no wheezes. He has no rales. He exhibits no tenderness.  Abdominal: Soft. Bowel sounds are normal. He exhibits no distension and no mass. There is no tenderness. There is no rebound and no guarding.    Musculoskeletal: Normal range of motion. He exhibits no edema and no tenderness.  Lymphadenopathy:    He has no cervical adenopathy.  Neurological: He is alert and oriented to person, place, and time. He has normal reflexes. No cranial nerve deficit. He exhibits normal muscle tone. He displays a negative Romberg sign. Coordination and gait normal.  No meningeal signs  Skin: Skin is warm and dry. No rash noted.  Psychiatric: He has a normal mood and affect. His behavior is normal. Judgment and thought content normal.     Lab Results  Component Value Date   WBC 10.0 03/06/2014   HGB 14.3 03/06/2014   HCT 42.0 03/06/2014   PLT 186.0 03/06/2014   GLUCOSE 91 03/06/2014   CHOL 191 03/06/2014   TRIG 281.0* 03/06/2014   HDL 47.00 03/06/2014   LDLDIRECT 145.8 07/01/2010   LDLCALC 88 03/06/2014   ALT 18 03/06/2014   AST 22 03/06/2014   NA 139 03/06/2014   K 3.8 03/06/2014   CL 105 03/06/2014   CREATININE 1.0 03/06/2014   BUN 17 03/06/2014   CO2 26 03/06/2014   TSH 1.05 03/06/2014   PSA 0.25 03/06/2014   INR 1.0 RATIO 06/26/2006        Assessment & Plan:

## 2014-03-23 NOTE — Assessment & Plan Note (Signed)
Diazepam prn 

## 2014-03-25 NOTE — Telephone Encounter (Signed)
Pt is seeing dr Liberty Handy

## 2014-03-25 NOTE — Telephone Encounter (Signed)
lmom for pt to cb

## 2014-03-31 LAB — COLOGUARD

## 2014-04-10 ENCOUNTER — Encounter: Payer: Self-pay | Admitting: Internal Medicine

## 2014-04-14 ENCOUNTER — Telehealth: Payer: Self-pay | Admitting: *Deleted

## 2014-04-14 NOTE — Telephone Encounter (Signed)
Pt informed of 03/31/14 negative colon cancer screening results.

## 2014-04-22 ENCOUNTER — Encounter: Payer: Self-pay | Admitting: Internal Medicine

## 2014-09-24 ENCOUNTER — Encounter: Payer: Self-pay | Admitting: Internal Medicine

## 2014-09-24 ENCOUNTER — Ambulatory Visit (INDEPENDENT_AMBULATORY_CARE_PROVIDER_SITE_OTHER): Payer: Medicare Other | Admitting: Internal Medicine

## 2014-09-24 VITALS — BP 120/80 | HR 72 | Temp 97.7°F | Ht 68.0 in | Wt 199.0 lb

## 2014-09-24 DIAGNOSIS — Z23 Encounter for immunization: Secondary | ICD-10-CM

## 2014-09-24 DIAGNOSIS — E785 Hyperlipidemia, unspecified: Secondary | ICD-10-CM

## 2014-09-24 DIAGNOSIS — L570 Actinic keratosis: Secondary | ICD-10-CM | POA: Insufficient documentation

## 2014-09-24 DIAGNOSIS — H6120 Impacted cerumen, unspecified ear: Secondary | ICD-10-CM | POA: Insufficient documentation

## 2014-09-24 DIAGNOSIS — I10 Essential (primary) hypertension: Secondary | ICD-10-CM

## 2014-09-24 DIAGNOSIS — H6121 Impacted cerumen, right ear: Secondary | ICD-10-CM

## 2014-09-24 DIAGNOSIS — Z0000 Encounter for general adult medical examination without abnormal findings: Secondary | ICD-10-CM

## 2014-09-24 DIAGNOSIS — N320 Bladder-neck obstruction: Secondary | ICD-10-CM

## 2014-09-24 MED ORDER — PITAVASTATIN CALCIUM 4 MG PO TABS: 4.0000 mg | ORAL_TABLET | ORAL | Status: DC

## 2014-09-24 MED ORDER — BENAZEPRIL HCL 20 MG PO TABS: ORAL_TABLET | ORAL | Status: DC

## 2014-09-24 MED ORDER — AZITHROMYCIN 250 MG PO TABS: ORAL_TABLET | ORAL | Status: DC

## 2014-09-24 MED ORDER — VITAMIN D 1000 UNITS PO TABS: 1000.0000 [IU] | ORAL_TABLET | Freq: Every day | ORAL | Status: AC

## 2014-09-24 NOTE — Progress Notes (Signed)
Pre visit review using our clinic review tool, if applicable. No additional management support is needed unless otherwise documented below in the visit note. 

## 2014-09-24 NOTE — Assessment & Plan Note (Signed)
1/16 face, hands See Procedure

## 2014-09-24 NOTE — Assessment & Plan Note (Signed)
Continue with current prescription therapy as reflected on the Med list.  

## 2014-09-24 NOTE — Patient Instructions (Signed)
Preventive Care for Adults A healthy lifestyle and preventive care can promote health and wellness. Preventive health guidelines for men include the following key practices:  A routine yearly physical is a good way to check with your health care provider about your health and preventative screening. It is a chance to share any concerns and updates on your health and to receive a thorough exam.  Visit your dentist for a routine exam and preventative care every 6 months. Brush your teeth twice a day and floss once a day. Good oral hygiene prevents tooth decay and gum disease.  The frequency of eye exams is based on your age, health, family medical history, use of contact lenses, and other factors. Follow your health care provider's recommendations for frequency of eye exams.  Eat a healthy diet. Foods such as vegetables, fruits, whole grains, low-fat dairy products, and lean protein foods contain the nutrients you need without too many calories. Decrease your intake of foods high in solid fats, added sugars, and salt. Eat the right amount of calories for you.Get information about a proper diet from your health care provider, if necessary.  Regular physical exercise is one of the most important things you can do for your health. Most adults should get at least 150 minutes of moderate-intensity exercise (any activity that increases your heart rate and causes you to sweat) each week. In addition, most adults need muscle-strengthening exercises on 2 or more days a week.  Maintain a healthy weight. The body mass index (BMI) is a screening tool to identify possible weight problems. It provides an estimate of body fat based on height and weight. Your health care provider can find your BMI and can help you achieve or maintain a healthy weight.For adults 20 years and older:  A BMI below 18.5 is considered underweight.  A BMI of 18.5 to 24.9 is normal.  A BMI of 25 to 29.9 is considered overweight.  A BMI  of 30 and above is considered obese.  Maintain normal blood lipids and cholesterol levels by exercising and minimizing your intake of saturated fat. Eat a balanced diet with plenty of fruit and vegetables. Blood tests for lipids and cholesterol should begin at age 57 and be repeated every 5 years. If your lipid or cholesterol levels are high, you are over 50, or you are at high risk for heart disease, you may need your cholesterol levels checked more frequently.Ongoing high lipid and cholesterol levels should be treated with medicines if diet and exercise are not working.  If you smoke, find out from your health care provider how to quit. If you do not use tobacco, do not start.  Lung cancer screening is recommended for adults aged 16-80 years who are at high risk for developing lung cancer because of a history of smoking. A yearly low-dose CT scan of the lungs is recommended for people who have at least a 30-pack-year history of smoking and are a current smoker or have quit within the past 15 years. A pack year of smoking is smoking an average of 1 pack of cigarettes a day for 1 year (for example: 1 pack a day for 30 years or 2 packs a day for 15 years). Yearly screening should continue until the smoker has stopped smoking for at least 15 years. Yearly screening should be stopped for people who develop a health problem that would prevent them from having lung cancer treatment.  If you choose to drink alcohol, do not have more than  2 drinks per day. One drink is considered to be 12 ounces (355 mL) of beer, 5 ounces (148 mL) of wine, or 1.5 ounces (44 mL) of liquor.  Avoid use of street drugs. Do not share needles with anyone. Ask for help if you need support or instructions about stopping the use of drugs.  High blood pressure causes heart disease and increases the risk of stroke. Your blood pressure should be checked at least every 1-2 years. Ongoing high blood pressure should be treated with  medicines, if weight loss and exercise are not effective.  If you are 45-79 years old, ask your health care provider if you should take aspirin to prevent heart disease.  Diabetes screening involves taking a blood sample to check your fasting blood sugar level. This should be done once every 3 years, after age 45, if you are within normal weight and without risk factors for diabetes. Testing should be considered at a younger age or be carried out more frequently if you are overweight and have at least 1 risk factor for diabetes.  Colorectal cancer can be detected and often prevented. Most routine colorectal cancer screening begins at the age of 50 and continues through age 75. However, your health care provider may recommend screening at an earlier age if you have risk factors for colon cancer. On a yearly basis, your health care provider may provide home test kits to check for hidden blood in the stool. Use of a small camera at the end of a tube to directly examine the colon (sigmoidoscopy or colonoscopy) can detect the earliest forms of colorectal cancer. Talk to your health care provider about this at age 50, when routine screening begins. Direct exam of the colon should be repeated every 5-10 years through age 75, unless early forms of precancerous polyps or small growths are found.  People who are at an increased risk for hepatitis B should be screened for this virus. You are considered at high risk for hepatitis B if:  You were born in a country where hepatitis B occurs often. Talk with your health care provider about which countries are considered high risk.  Your parents were born in a high-risk country and you have not received a shot to protect against hepatitis B (hepatitis B vaccine).  You have HIV or AIDS.  You use needles to inject street drugs.  You live with, or have sex with, someone who has hepatitis B.  You are a man who has sex with other men (MSM).  You get hemodialysis  treatment.  You take certain medicines for conditions such as cancer, organ transplantation, and autoimmune conditions.  Hepatitis C blood testing is recommended for all people born from 1945 through 1965 and any individual with known risks for hepatitis C.  Practice safe sex. Use condoms and avoid high-risk sexual practices to reduce the spread of sexually transmitted infections (STIs). STIs include gonorrhea, chlamydia, syphilis, trichomonas, herpes, HPV, and human immunodeficiency virus (HIV). Herpes, HIV, and HPV are viral illnesses that have no cure. They can result in disability, cancer, and death.  If you are at risk of being infected with HIV, it is recommended that you take a prescription medicine daily to prevent HIV infection. This is called preexposure prophylaxis (PrEP). You are considered at risk if:  You are a man who has sex with other men (MSM) and have other risk factors.  You are a heterosexual man, are sexually active, and are at increased risk for HIV infection.    You take drugs by injection.  You are sexually active with a partner who has HIV.  Talk with your health care provider about whether you are at high risk of being infected with HIV. If you choose to begin PrEP, you should first be tested for HIV. You should then be tested every 3 months for as long as you are taking PrEP.  A one-time screening for abdominal aortic aneurysm (AAA) and surgical repair of large AAAs by ultrasound are recommended for men ages 32 to 67 years who are current or former smokers.  Healthy men should no longer receive prostate-specific antigen (PSA) blood tests as part of routine cancer screening. Talk with your health care provider about prostate cancer screening.  Testicular cancer screening is not recommended for adult males who have no symptoms. Screening includes self-exam, a health care provider exam, and other screening tests. Consult with your health care provider about any symptoms  you have or any concerns you have about testicular cancer.  Use sunscreen. Apply sunscreen liberally and repeatedly throughout the day. You should seek shade when your shadow is shorter than you. Protect yourself by wearing long sleeves, pants, a wide-brimmed hat, and sunglasses year round, whenever you are outdoors.  Once a month, do a whole-body skin exam, using a mirror to look at the skin on your back. Tell your health care provider about new moles, moles that have irregular borders, moles that are larger than a pencil eraser, or moles that have changed in shape or color.  Stay current with required vaccines (immunizations).  Influenza vaccine. All adults should be immunized every year.  Tetanus, diphtheria, and acellular pertussis (Td, Tdap) vaccine. An adult who has not previously received Tdap or who does not know his vaccine status should receive 1 dose of Tdap. This initial dose should be followed by tetanus and diphtheria toxoids (Td) booster doses every 10 years. Adults with an unknown or incomplete history of completing a 3-dose immunization series with Td-containing vaccines should begin or complete a primary immunization series including a Tdap dose. Adults should receive a Td booster every 10 years.  Varicella vaccine. An adult without evidence of immunity to varicella should receive 2 doses or a second dose if he has previously received 1 dose.  Human papillomavirus (HPV) vaccine. Males aged 68-21 years who have not received the vaccine previously should receive the 3-dose series. Males aged 22-26 years may be immunized. Immunization is recommended through the age of 6 years for any male who has sex with males and did not get any or all doses earlier. Immunization is recommended for any person with an immunocompromised condition through the age of 49 years if he did not get any or all doses earlier. During the 3-dose series, the second dose should be obtained 4-8 weeks after the first  dose. The third dose should be obtained 24 weeks after the first dose and 16 weeks after the second dose.  Zoster vaccine. One dose is recommended for adults aged 50 years or older unless certain conditions are present.  Measles, mumps, and rubella (MMR) vaccine. Adults born before [birth year] generally are considered immune to measles and mumps. Adults born in [birth year] or later should have 1 or more doses of MMR vaccine unless there is a contraindication to the vaccine or there is laboratory evidence of immunity to each of the three diseases. A routine second dose of MMR vaccine should be obtained at least 28 days after the first dose for students attending postsecondary  schools, health care workers, or international travelers. People who received inactivated measles vaccine or an unknown type of measles vaccine during 1963-1967 should receive 2 doses of MMR vaccine. People who received inactivated mumps vaccine or an unknown type of mumps vaccine before 1979 and are at high risk for mumps infection should consider immunization with 2 doses of MMR vaccine. Unvaccinated health care workers born before 1957 who lack laboratory evidence of measles, mumps, or rubella immunity or laboratory confirmation of disease should consider measles and mumps immunization with 2 doses of MMR vaccine or rubella immunization with 1 dose of MMR vaccine.  Pneumococcal 13-valent conjugate (PCV13) vaccine. When indicated, a person who is uncertain of his immunization history and has no record of immunization should receive the PCV13 vaccine. An adult aged 19 years or older who has certain medical conditions and has not been previously immunized should receive 1 dose of PCV13 vaccine. This PCV13 should be followed with a dose of pneumococcal polysaccharide (PPSV23) vaccine. The PPSV23 vaccine dose should be obtained at least 8 weeks after the dose of PCV13 vaccine. An adult aged 19 years or older who has certain medical conditions and  previously received 1 or more doses of PPSV23 vaccine should receive 1 dose of PCV13. The PCV13 vaccine dose should be obtained 1 or more years after the last PPSV23 vaccine dose.  Pneumococcal polysaccharide (PPSV23) vaccine. When PCV13 is also indicated, PCV13 should be obtained first. All adults aged 65 years and older should be immunized. An adult younger than age 65 years who has certain medical conditions should be immunized. Any person who resides in a nursing home or long-term care facility should be immunized. An adult smoker should be immunized. People with an immunocompromised condition and certain other conditions should receive both PCV13 and PPSV23 vaccines. People with human immunodeficiency virus (HIV) infection should be immunized as soon as possible after diagnosis. Immunization during chemotherapy or radiation therapy should be avoided. Routine use of PPSV23 vaccine is not recommended for American Indians, [place] Natives, or people younger than 65 years unless there are medical conditions that require PPSV23 vaccine. When indicated, people who have unknown immunization and have no record of immunization should receive PPSV23 vaccine. One-time revaccination 5 years after the first dose of PPSV23 is recommended for people aged 19-64 years who have chronic kidney failure, nephrotic syndrome, asplenia, or immunocompromised conditions. People who received 1-2 doses of PPSV23 before age 65 years should receive another dose of PPSV23 vaccine at age 65 years or later if at least 5 years have passed since the previous dose. Doses of PPSV23 are not needed for people immunized with PPSV23 at or after age 65 years.  Meningococcal vaccine. Adults with asplenia or persistent complement component deficiencies should receive 2 doses of quadrivalent meningococcal conjugate (MenACWY-D) vaccine. The doses should be obtained at least 2 months apart. Microbiologists working with certain meningococcal bacteria,  military recruits, people at risk during an outbreak, and people who travel to or live in countries with a high rate of meningitis should be immunized. A first-year college student up through age 21 years who is living in a residence hall should receive a dose if he did not receive a dose on or after his 16th birthday. Adults who have certain high-risk conditions should receive one or more doses of vaccine.  Hepatitis A vaccine. Adults who wish to be protected from this disease, have certain high-risk conditions, work with hepatitis A-infected animals, work in hepatitis A research labs, or   travel to or work in countries with a high rate of hepatitis A should be immunized. Adults who were previously unvaccinated and who anticipate close contact with an international adoptee during the first 60 days after arrival in the [place] from a country with a high rate of hepatitis A should be immunized.  Hepatitis B vaccine. Adults should be immunized if they wish to be protected from this disease, have certain high-risk conditions, may be exposed to blood or other infectious body fluids, are household contacts or sex partners of hepatitis B positive people, are clients or workers in certain care facilities, or travel to or work in countries with a high rate of hepatitis B.  Haemophilus influenzae type b (Hib) vaccine. A previously unvaccinated person with asplenia or sickle cell disease or having a scheduled splenectomy should receive 1 dose of Hib vaccine. Regardless of previous immunization, a recipient of a hematopoietic stem cell transplant should receive a 3-dose series 6-12 months after his successful transplant. Hib vaccine is not recommended for adults with HIV infection. Preventive Service / Frequency Ages 72 to 64  Blood pressure check.** / Every 1 to 2 years.  Lipid and cholesterol check.** / Every 5 years beginning at age 62.  Hepatitis C blood test.** / For any individual with known risks for  hepatitis C.  Skin self-exam. / Monthly.  Influenza vaccine. / Every year.  Tetanus, diphtheria, and acellular pertussis (Tdap, Td) vaccine.** / Consult your health care provider. 1 dose of Td every 10 years.  Varicella vaccine.** / Consult your health care provider.  HPV vaccine. / 3 doses over 6 months, if 44 or younger.  Measles, mumps, rubella (MMR) vaccine.** / You need at least 1 dose of MMR if you were born in 1957 or later. You may also need a second dose.  Pneumococcal 13-valent conjugate (PCV13) vaccine.** / Consult your health care provider.  Pneumococcal polysaccharide (PPSV23) vaccine.** / 1 to 2 doses if you smoke cigarettes or if you have certain conditions.  Meningococcal vaccine.** / 1 dose if you are age 55 to 60 years and a Market researcher living in a residence hall, or have one of several medical conditions. You may also need additional booster doses.  Hepatitis A vaccine.** / Consult your health care provider.  Hepatitis B vaccine.** / Consult your health care provider.  Haemophilus influenzae type b (Hib) vaccine.** / Consult your health care provider. Ages 15 to 72  Blood pressure check.** / Every 1 to 2 years.  Lipid and cholesterol check.** / Every 5 years beginning at age 46.  Lung cancer screening. / Every year if you are aged 39-80 years and have a 30-pack-year history of smoking and currently smoke or have quit within the past 15 years. Yearly screening is stopped once you have quit smoking for at least 15 years or develop a health problem that would prevent you from having lung cancer treatment.  Fecal occult blood test (FOBT) of stool. / Every year beginning at age 75 and continuing until age 44. You may not have to do this test if you get a colonoscopy every 10 years.  Flexible sigmoidoscopy** or colonoscopy.** / Every 5 years for a flexible sigmoidoscopy or every 10 years for a colonoscopy beginning at age 66 and continuing until age  52.  Hepatitis C blood test.** / For all people born from [birth year] through 1965 and any individual with known risks for hepatitis C.  Skin self-exam. / Monthly.  Influenza vaccine. / Every  year.  Tetanus, diphtheria, and acellular pertussis (Tdap/Td) vaccine.** / Consult your health care provider. 1 dose of Td every 10 years.  Varicella vaccine.** / Consult your health care provider.  Zoster vaccine.** / 1 dose for adults aged 79 years or older.  Measles, mumps, rubella (MMR) vaccine.** / You need at least 1 dose of MMR if you were born in 1957 or later. You may also need a second dose.  Pneumococcal 13-valent conjugate (PCV13) vaccine.** / Consult your health care provider.  Pneumococcal polysaccharide (PPSV23) vaccine.** / 1 to 2 doses if you smoke cigarettes or if you have certain conditions.  Meningococcal vaccine.** / Consult your health care provider.  Hepatitis A vaccine.** / Consult your health care provider.  Hepatitis B vaccine.** / Consult your health care provider.  Haemophilus influenzae type b (Hib) vaccine.** / Consult your health care provider. Ages 3 and over  Blood pressure check.** / Every 1 to 2 years.  Lipid and cholesterol check.**/ Every 5 years beginning at age 38.  Lung cancer screening. / Every year if you are aged 33-80 years and have a 30-pack-year history of smoking and currently smoke or have quit within the past 15 years. Yearly screening is stopped once you have quit smoking for at least 15 years or develop a health problem that would prevent you from having lung cancer treatment.  Fecal occult blood test (FOBT) of stool. / Every year beginning at age 31 and continuing until age 65. You may not have to do this test if you get a colonoscopy every 10 years.  Flexible sigmoidoscopy** or colonoscopy.** / Every 5 years for a flexible sigmoidoscopy or every 10 years for a colonoscopy beginning at age 70 and continuing until age 88.  Hepatitis C blood  test.** / For all people born from [birth year] through 1965 and any individual with known risks for hepatitis C.  Abdominal aortic aneurysm (AAA) screening.** / A one-time screening for ages 41 to 76 years who are current or former smokers.  Skin self-exam. / Monthly.  Influenza vaccine. / Every year.  Tetanus, diphtheria, and acellular pertussis (Tdap/Td) vaccine.** / 1 dose of Td every 10 years.  Varicella vaccine.** / Consult your health care provider.  Zoster vaccine.** / 1 dose for adults aged 59 years or older.  Pneumococcal 13-valent conjugate (PCV13) vaccine.** / Consult your health care provider.  Pneumococcal polysaccharide (PPSV23) vaccine.** / 1 dose for all adults aged 100 years and older.  Meningococcal vaccine.** / Consult your health care provider.  Hepatitis A vaccine.** / Consult your health care provider.  Hepatitis B vaccine.** / Consult your health care provider.  Haemophilus influenzae type b (Hib) vaccine.** / Consult your health care provider. **Family history and personal history of risk and conditions may change your health care provider's recommendations. Document Released: 10/24/2001 Document Revised: 09/02/2013 Document Reviewed: 01/23/2011 [facility] Patient Information 2015 [place], [place]. This information is not intended to replace advice given to you by your health care provider. Make sure you discuss any questions you have with your health care provider.

## 2014-09-24 NOTE — Assessment & Plan Note (Signed)
Will irrigate 

## 2014-09-24 NOTE — Assessment & Plan Note (Signed)

## 2014-09-24 NOTE — Progress Notes (Signed)
Subjective:    HPI  The patient is here for a wellness exam. The patient has been doing well overall without major physical or psychological issues going on lately.  The patient presents for a follow-up of  chronic hypertension, chronic dyslipidemia, Menier's controlled with medicines Active w/[facility]  BP Readings from Last 3 Encounters:  09/24/14 120/80  03/23/14 130/82  09/04/13 127/80   Wt Readings from Last 3 Encounters:  09/24/14 199 lb (90.266 kg)  03/23/14 202 lb (91.627 kg)  09/04/13 195 lb (88.451 kg)      Review of Systems  Constitutional: Negative for appetite change, fatigue and unexpected weight change.  HENT: Negative for congestion, nosebleeds, sneezing, sore throat and trouble swallowing.   Eyes: Negative for itching and visual disturbance.  Respiratory: Negative for cough.   Cardiovascular: Negative for chest pain, palpitations and leg swelling.  Gastrointestinal: Negative for nausea, diarrhea, blood in stool and abdominal distention.  Genitourinary: Negative for frequency and hematuria.  Musculoskeletal: Negative for back pain, joint swelling, gait problem and neck pain.  Skin: Negative for rash.  Neurological: Negative for dizziness, tremors, speech difficulty and weakness.  Psychiatric/Behavioral: Negative for suicidal ideas, sleep disturbance, dysphoric mood and agitation. The patient is not nervous/anxious.        Objective:   Physical Exam  Constitutional: He is oriented to person, place, and time. He appears well-developed. No distress.  NAD  HENT:  Mouth/Throat: Oropharynx is clear and moist.  Eyes: Conjunctivae are normal. Pupils are equal, round, and reactive to light.  Neck: Normal range of motion. No JVD present. No thyromegaly present.  Cardiovascular: Normal rate, regular rhythm, normal heart sounds and intact distal pulses.  Exam reveals no gallop and no friction rub.   No murmur heard. Pulmonary/Chest: Effort normal and  breath sounds normal. No respiratory distress. He has no wheezes. He has no rales. He exhibits no tenderness.  Abdominal: Soft. Bowel sounds are normal. He exhibits no distension and no mass. There is no tenderness. There is no rebound and no guarding.  Musculoskeletal: Normal range of motion. He exhibits no edema or tenderness.  Lymphadenopathy:    He has no cervical adenopathy.  Neurological: He is alert and oriented to person, place, and time. He has normal reflexes. No cranial nerve deficit. He exhibits normal muscle tone. He displays a negative Romberg sign. Coordination and gait normal.  No meningeal signs  Skin: Skin is warm and dry. No rash noted.  Psychiatric: He has a normal mood and affect. His behavior is normal. Judgment and thought content normal.  Wax R AKs   Lab Results  Component Value Date   WBC 10.0 03/06/2014   HGB 14.3 03/06/2014   HCT 42.0 03/06/2014   PLT 186.0 03/06/2014   GLUCOSE 91 03/06/2014   CHOL 191 03/06/2014   TRIG 281.0* 03/06/2014   HDL 47.00 03/06/2014   LDLDIRECT 145.8 07/01/2010   LDLCALC 88 03/06/2014   ALT 18 03/06/2014   AST 22 03/06/2014   NA 139 03/06/2014   K 3.8 03/06/2014   CL 105 03/06/2014   CREATININE 1.0 03/06/2014   BUN 17 03/06/2014   CO2 26 03/06/2014   TSH 1.05 03/06/2014   PSA 0.25 03/06/2014   INR 1.0 RATIO 06/26/2006     Procedure Note :     Procedure : Cryosurgery   Indication: Actinic keratosis(es)   Risks including unsuccessful procedure , bleeding, infection, bruising, scar, a need for a repeat  procedure and others were explained to  the patient in detail as well as the benefits. Informed consent was obtained verbally.   6  lesion(s)  On face and B hands  was/were treated with liquid nitrogen on a Q-tip in a usual fasion . Band-Aid was applied and antibiotic ointment was given for a later use.   Tolerated well. Complications none.   Postprocedure instructions :     Keep the wounds clean. You can wash them  with liquid soap and water. Pat dry with gauze or a Kleenex tissue  Before applying antibiotic ointment and a Band-Aid.   You need to report immediately  if  any signs of infection develop.     Procedure Note :     Procedure :  Ear irrigation   Indication:  Cerumen impaction R   Risks, including pain, dizziness, eardrum perforation, bleeding, infection and others as well as benefits were explained to the patient in detail. Verbal consent was obtained and the patient agreed to proceed.    We used "The Elephant Ear Irrigation Device" filled with lukewarm water for irrigation. A large amount wax was recovered. Procedure has also required manual wax removal with an ear loop.   Tolerated well. Complications: None.   Postprocedure instructions :  Call if problems.      Assessment & Plan:

## 2014-09-25 ENCOUNTER — Encounter: Payer: Self-pay | Admitting: Internal Medicine

## 2014-09-25 ENCOUNTER — Other Ambulatory Visit (INDEPENDENT_AMBULATORY_CARE_PROVIDER_SITE_OTHER): Payer: Medicare Other

## 2014-09-25 DIAGNOSIS — M24119 Other articular cartilage disorders, unspecified shoulder: Secondary | ICD-10-CM

## 2014-09-25 DIAGNOSIS — N320 Bladder-neck obstruction: Secondary | ICD-10-CM

## 2014-09-25 DIAGNOSIS — E785 Hyperlipidemia, unspecified: Secondary | ICD-10-CM

## 2014-09-25 DIAGNOSIS — Z0000 Encounter for general adult medical examination without abnormal findings: Secondary | ICD-10-CM

## 2014-09-25 DIAGNOSIS — I10 Essential (primary) hypertension: Secondary | ICD-10-CM

## 2014-09-25 LAB — PSA: PSA: 0.23 ng/mL (ref 0.10–4.00)

## 2014-09-25 LAB — URINALYSIS
Bilirubin Urine: NEGATIVE
HGB URINE DIPSTICK: NEGATIVE
Ketones, ur: NEGATIVE
LEUKOCYTES UA: NEGATIVE
Nitrite: NEGATIVE
Specific Gravity, Urine: 1.02 (ref 1.000–1.030)
Total Protein, Urine: NEGATIVE
Urine Glucose: NEGATIVE
Urobilinogen, UA: 0.2 (ref 0.0–1.0)
pH: 5.5 (ref 5.0–8.0)

## 2014-09-25 LAB — LIPID PANEL
Cholesterol: 165 mg/dL (ref 0–200)
HDL: 41.3 mg/dL (ref >39.00)
LDL Cholesterol: 108 mg/dL — ABNORMAL HIGH (ref 0–99)
NONHDL: 123.7
Total CHOL/HDL Ratio: 4
Triglycerides: 79 mg/dL (ref 0.0–149.0)
VLDL: 15.8 mg/dL (ref 0.0–40.0)

## 2014-09-25 LAB — HEPATIC FUNCTION PANEL
ALBUMIN: 3.9 g/dL (ref 3.5–5.2)
ALT: 20 U/L (ref 0–53)
AST: 21 U/L (ref 0–37)
Alkaline Phosphatase: 67 U/L (ref 39–117)
BILIRUBIN DIRECT: 0.1 mg/dL (ref 0.0–0.3)
BILIRUBIN TOTAL: 0.5 mg/dL (ref 0.2–1.2)
Total Protein: 6.8 g/dL (ref 6.0–8.3)

## 2014-09-25 LAB — CBC WITH DIFFERENTIAL/PLATELET
BASOS ABS: 0 10*3/uL (ref 0.0–0.1)
BASOS PCT: 0.4 % (ref 0.0–3.0)
Eosinophils Absolute: 0.3 10*3/uL (ref 0.0–0.7)
Eosinophils Relative: 2.4 % (ref 0.0–5.0)
HEMATOCRIT: 42.3 % (ref 39.0–52.0)
Hemoglobin: 13.9 g/dL (ref 13.0–17.0)
Lymphocytes Relative: 26.9 % (ref 12.0–46.0)
Lymphs Abs: 3 10*3/uL (ref 0.7–4.0)
MCHC: 32.8 g/dL (ref 30.0–36.0)
MCV: 96.9 fl (ref 78.0–100.0)
MONO ABS: 0.7 10*3/uL (ref 0.1–1.0)
MONOS PCT: 6.4 % (ref 3.0–12.0)
NEUTROS ABS: 7.1 10*3/uL (ref 1.4–7.7)
NEUTROS PCT: 63.9 % (ref 43.0–77.0)
PLATELETS: 207 10*3/uL (ref 150.0–400.0)
RBC: 4.36 Mil/uL (ref 4.22–5.81)
RDW: 14 % (ref 11.5–15.5)
WBC: 11 10*3/uL — ABNORMAL HIGH (ref 4.0–10.5)

## 2014-09-25 LAB — TSH: TSH: 0.81 u[IU]/mL (ref 0.35–4.50)

## 2014-09-25 LAB — BASIC METABOLIC PANEL
BUN: 15 mg/dL (ref 6–23)
CO2: 25 meq/L (ref 19–32)
CREATININE: 0.96 mg/dL (ref 0.40–1.50)
Calcium: 9.1 mg/dL (ref 8.4–10.5)
Chloride: 106 mEq/L (ref 96–112)
GFR: 82.07 mL/min (ref >60.00)
Glucose, Bld: 99 mg/dL (ref 70–99)
POTASSIUM: 4.3 meq/L (ref 3.5–5.1)
SODIUM: 140 meq/L (ref 135–145)

## 2014-10-26 ENCOUNTER — Telehealth: Payer: Self-pay | Admitting: *Deleted

## 2014-10-26 NOTE — Telephone Encounter (Signed)
Received PA form for Livalo 4 mg. Per Dr. Alain Marion, not initiating PA. Will send completed/signed form to be scanned in to chart, but I did not fax it to pt's plan. MD advised pt to pay for [facility]

## 2015-03-25 ENCOUNTER — Encounter: Payer: Self-pay | Admitting: Internal Medicine

## 2015-03-25 ENCOUNTER — Ambulatory Visit (INDEPENDENT_AMBULATORY_CARE_PROVIDER_SITE_OTHER): Payer: Medicare Other | Admitting: Internal Medicine

## 2015-03-25 VITALS — BP 110/84 | HR 70 | Wt 197.0 lb

## 2015-03-25 DIAGNOSIS — E785 Hyperlipidemia, unspecified: Secondary | ICD-10-CM

## 2015-03-25 DIAGNOSIS — H8109 Meniere's disease, unspecified ear: Secondary | ICD-10-CM | POA: Diagnosis not present

## 2015-03-25 DIAGNOSIS — I10 Essential (primary) hypertension: Secondary | ICD-10-CM

## 2015-03-25 MED ORDER — PRAVASTATIN SODIUM 20 MG PO TABS
20.0000 mg | ORAL_TABLET | Freq: Every day | ORAL | Status: DC
Start: 2015-03-25 — End: 2016-04-16

## 2015-03-25 NOTE — Assessment & Plan Note (Signed)
7/16 d/c Livalo due to cost, start Pravachol

## 2015-03-25 NOTE — Assessment & Plan Note (Signed)
Diazepam prn 

## 2015-03-25 NOTE — Progress Notes (Signed)
Pre visit review using our clinic review tool, if applicable. No additional management support is needed unless otherwise documented below in the visit note. 

## 2015-03-25 NOTE — Assessment & Plan Note (Signed)
On Lotensin 

## 2015-03-25 NOTE — Progress Notes (Signed)
   Subjective:  Patient ID: John Rowe, male    DOB: [birth year]  Age: 69 y.o. MRN: [id number]  CC: No chief complaint on file.   HPI EDIEL UNANGST presents for HTN, vertigo, dyslipidemia. Livalo is too $$$  Outpatient Prescriptions Prior to Visit  Medication Sig Dispense Refill  . azithromycin (ZITHROMAX) 250 MG tablet As directed 6 tablet 0  . benazepril (LOTENSIN) 20 MG tablet Take one half  tablet by mouth one time daily 90 tablet 3  . cholecalciferol (VITAMIN D) 1000 UNITS tablet Take 1 tablet (1,000 Units total) by mouth daily. 100 tablet 3  . diazepam (VALIUM) 5 MG tablet Take one tablet by mouth daily as needed 30 tablet 3  . Pitavastatin Calcium (LIVALO) 4 MG TABS Take 1 tablet (4 mg total) by mouth every Friday. 30 tablet 1   No facility-administered medications prior to visit.    ROS Review of Systems  Objective:  BP 110/84 mmHg  Pulse 70  Wt 197 lb (89.359 kg)  SpO2 97%  BP Readings from Last 3 Encounters:  03/25/15 110/84  09/24/14 120/80  03/23/14 130/82    Wt Readings from Last 3 Encounters:  03/25/15 197 lb (89.359 kg)  09/24/14 199 lb (90.266 kg)  03/23/14 202 lb (91.627 kg)    Physical Exam  Lab Results  Component Value Date   WBC 11.0* 09/25/2014   HGB 13.9 09/25/2014   HCT 42.3 09/25/2014   PLT 207.0 09/25/2014   GLUCOSE 99 09/25/2014   CHOL 165 09/25/2014   TRIG 79.0 09/25/2014   HDL 41.30 09/25/2014   LDLDIRECT 145.8 07/01/2010   LDLCALC 108* 09/25/2014   ALT 20 09/25/2014   AST 21 09/25/2014   NA 140 09/25/2014   K 4.3 09/25/2014   CL 106 09/25/2014   CREATININE 0.96 09/25/2014   BUN 15 09/25/2014   CO2 25 09/25/2014   TSH 0.81 09/25/2014   PSA 0.23 09/25/2014   INR 1.0 RATIO 06/26/2006    No results found.  Assessment & Plan:   Diagnoses and all orders for this visit:  Meniere's disease, unspecified laterality  Essential hypertension  Dyslipidemia  Other orders -     pravastatin (PRAVACHOL) 20 MG tablet; Take  1 tablet (20 mg total) by mouth daily.  I have discontinued Mr. Forrester Pitavastatin Calcium. I am also having him start on pravastatin. Additionally, I am having him maintain his diazepam, cholecalciferol, benazepril, and azithromycin.  Meds ordered this encounter  Medications  . pravastatin (PRAVACHOL) 20 MG tablet    Sig: Take 1 tablet (20 mg total) by mouth daily.    Dispense:  30 tablet    Refill:  11     Follow-up: Return in about 6 months (around 09/25/2015) for Wellness Exam.  Walker Kehr, MD

## 2015-09-27 ENCOUNTER — Encounter: Payer: Self-pay | Admitting: Internal Medicine

## 2015-09-27 ENCOUNTER — Other Ambulatory Visit (INDEPENDENT_AMBULATORY_CARE_PROVIDER_SITE_OTHER): Payer: Medicare Other

## 2015-09-27 ENCOUNTER — Ambulatory Visit (INDEPENDENT_AMBULATORY_CARE_PROVIDER_SITE_OTHER): Payer: Medicare Other | Admitting: Internal Medicine

## 2015-09-27 VITALS — BP 120/84 | HR 71 | Ht 67.75 in | Wt 200.0 lb

## 2015-09-27 DIAGNOSIS — I10 Essential (primary) hypertension: Secondary | ICD-10-CM

## 2015-09-27 DIAGNOSIS — Z0000 Encounter for general adult medical examination without abnormal findings: Secondary | ICD-10-CM | POA: Diagnosis not present

## 2015-09-27 DIAGNOSIS — L570 Actinic keratosis: Secondary | ICD-10-CM

## 2015-09-27 DIAGNOSIS — M722 Plantar fascial fibromatosis: Secondary | ICD-10-CM | POA: Insufficient documentation

## 2015-09-27 DIAGNOSIS — R202 Paresthesia of skin: Secondary | ICD-10-CM | POA: Diagnosis not present

## 2015-09-27 LAB — BASIC METABOLIC PANEL
BUN: 9 mg/dL (ref 6–23)
CALCIUM: 9.2 mg/dL (ref 8.4–10.5)
CHLORIDE: 103 meq/L (ref 96–112)
CO2: 25 meq/L (ref 19–32)
Creatinine, Ser: 1.05 mg/dL (ref 0.40–1.50)
GFR: 73.79 mL/min (ref >60.00)
GLUCOSE: 104 mg/dL — AB (ref 70–99)
POTASSIUM: 4.5 meq/L (ref 3.5–5.1)
SODIUM: 140 meq/L (ref 135–145)

## 2015-09-27 LAB — URINALYSIS
BILIRUBIN URINE: NEGATIVE
HGB URINE DIPSTICK: NEGATIVE
KETONES UR: NEGATIVE
LEUKOCYTES UA: NEGATIVE
NITRITE: NEGATIVE
Specific Gravity, Urine: 1.01 (ref 1.000–1.030)
TOTAL PROTEIN, URINE-UPE24: NEGATIVE
Urine Glucose: NEGATIVE
Urobilinogen, UA: 0.2 (ref 0.0–1.0)
pH: 7 (ref 5.0–8.0)

## 2015-09-27 LAB — CBC WITH DIFFERENTIAL/PLATELET
BASOS ABS: 0.1 10*3/uL (ref 0.0–0.1)
BASOS PCT: 1.5 % (ref 0.0–3.0)
EOS PCT: 2.6 % (ref 0.0–5.0)
Eosinophils Absolute: 0.3 10*3/uL (ref 0.0–0.7)
HEMATOCRIT: 43.1 % (ref 39.0–52.0)
Hemoglobin: 14.3 g/dL (ref 13.0–17.0)
LYMPHS ABS: 3.1 10*3/uL (ref 0.7–4.0)
LYMPHS PCT: 31.1 % (ref 12.0–46.0)
MCHC: 33.3 g/dL (ref 30.0–36.0)
MCV: 97.8 fl (ref 78.0–100.0)
MONOS PCT: 8 % (ref 3.0–12.0)
Monocytes Absolute: 0.8 10*3/uL (ref 0.1–1.0)
NEUTROS ABS: 5.6 10*3/uL (ref 1.4–7.7)
NEUTROS PCT: 56.8 % (ref 43.0–77.0)
PLATELETS: 200 10*3/uL (ref 150.0–400.0)
RBC: 4.4 Mil/uL (ref 4.22–5.81)
RDW: 13.6 % (ref 11.5–15.5)
WBC: 9.9 10*3/uL (ref 4.0–10.5)

## 2015-09-27 LAB — LIPID PANEL
CHOLESTEROL: 171 mg/dL (ref 0–200)
HDL: 56.5 mg/dL (ref >39.00)
LDL Cholesterol: 96 mg/dL (ref 0–99)
NONHDL: 114.39
Total CHOL/HDL Ratio: 3
Triglycerides: 93 mg/dL (ref 0.0–149.0)
VLDL: 18.6 mg/dL (ref 0.0–40.0)

## 2015-09-27 LAB — HEPATIC FUNCTION PANEL
ALBUMIN: 4 g/dL (ref 3.5–5.2)
ALK PHOS: 60 U/L (ref 39–117)
ALT: 15 U/L (ref 0–53)
AST: 18 U/L (ref 0–37)
Bilirubin, Direct: 0.2 mg/dL (ref 0.0–0.3)
TOTAL PROTEIN: 6.7 g/dL (ref 6.0–8.3)
Total Bilirubin: 0.7 mg/dL (ref 0.2–1.2)

## 2015-09-27 LAB — PSA: PSA: 0.21 ng/mL (ref 0.10–4.00)

## 2015-09-27 LAB — TSH: TSH: 1.02 u[IU]/mL (ref 0.35–4.50)

## 2015-09-27 LAB — VITAMIN B12: VITAMIN B 12: 193 pg/mL — AB (ref 211–911)

## 2015-09-27 MED ORDER — METRONIDAZOLE 1 % EX GEL
Freq: Every day | CUTANEOUS | Status: DC
Start: 2015-09-27 — End: 2017-10-11

## 2015-09-27 MED ORDER — BENAZEPRIL HCL 20 MG PO TABS: ORAL_TABLET | ORAL | Status: DC

## 2015-09-27 MED ORDER — ASPIRIN 325 MG PO TABS: 325.0000 mg | ORAL_TABLET | Freq: Every day | ORAL | Status: DC

## 2015-09-27 NOTE — Assessment & Plan Note (Signed)
Derm ref

## 2015-09-27 NOTE — Assessment & Plan Note (Addendum)
  1/17?L TIA  episodic lightheadedness and a tingling feeling over R side of his body -  X seconds - 3-4 times in the last 4-5 mo   EKG Carot doppler Start ASA Neurol ref

## 2015-09-27 NOTE — Progress Notes (Signed)
Subjective:  Patient ID: John Rowe, male    DOB: [birth year]  Age: 70 y.o. MRN: [id number]  CC: No chief complaint on file.   HPI John Rowe presents for a well exam. C/o R heel pain C/o episodic lightheadedness and a tingling feel over R side - seconds - 3-4 times in 4-5 mo  Outpatient Prescriptions Prior to Visit  Medication Sig Dispense Refill  . diazepam (VALIUM) 5 MG tablet Take one tablet by mouth daily as needed 30 tablet 3  . pravastatin (PRAVACHOL) 20 MG tablet Take 1 tablet (20 mg total) by mouth daily. 30 tablet 11  . benazepril (LOTENSIN) 20 MG tablet Take one half  tablet by mouth one time daily 90 tablet 3   No facility-administered medications prior to visit.    ROS Review of Systems  Constitutional: Negative for appetite change, fatigue and unexpected weight change.  HENT: Negative for congestion, nosebleeds, sneezing, sore throat and trouble swallowing.   Eyes: Negative for itching and visual disturbance.  Respiratory: Negative for cough.   Cardiovascular: Negative for chest pain, palpitations and leg swelling.  Gastrointestinal: Negative for nausea, diarrhea, blood in stool and abdominal distention.  Genitourinary: Negative for frequency and hematuria.  Musculoskeletal: Positive for back pain, arthralgias and gait problem. Negative for joint swelling and neck pain.  Skin: Negative for rash.  Neurological: Negative for dizziness, tremors, speech difficulty and weakness.  Psychiatric/Behavioral: Negative for suicidal ideas, sleep disturbance, dysphoric mood and agitation. The patient is not nervous/anxious.     Objective:  BP 120/84 mmHg  Pulse 71  Ht 5' 7.75" (1.721 m)  Wt 200 lb (90.719 kg)  BMI 30.63 kg/m2  SpO2 96%  BP Readings from Last 3 Encounters:  09/27/15 120/84  03/25/15 110/84  09/24/14 120/80    Wt Readings from Last 3 Encounters:  09/27/15 200 lb (90.719 kg)  03/25/15 197 lb (89.359 kg)  09/24/14 199 lb (90.266 kg)     Physical Exam  Constitutional: He is oriented to person, place, and time. He appears well-developed. No distress.  NAD  HENT:  Mouth/Throat: Oropharynx is clear and moist.  Eyes: Conjunctivae are normal. Pupils are equal, round, and reactive to light.  Neck: Normal range of motion. No JVD present. No thyromegaly present.  Cardiovascular: Normal rate, regular rhythm, normal heart sounds and intact distal pulses.  Exam reveals no gallop and no friction rub.   No murmur heard. Pulmonary/Chest: Effort normal and breath sounds normal. No respiratory distress. He has no wheezes. He has no rales. He exhibits no tenderness.  Abdominal: Soft. Bowel sounds are normal. He exhibits no distension and no mass. There is no tenderness. There is no rebound and no guarding.  Genitourinary: Rectum normal and prostate normal. Guaiac negative stool.  Musculoskeletal: Normal range of motion. He exhibits tenderness. He exhibits no edema.  Lymphadenopathy:    He has no cervical adenopathy.  Neurological: He is alert and oriented to person, place, and time. He has normal reflexes. No cranial nerve deficit. He exhibits normal muscle tone. He displays a negative Romberg sign. Coordination and gait normal.  Skin: Skin is warm and dry. No rash noted.  Psychiatric: He has a normal mood and affect. His behavior is normal. Judgment and thought content normal.   Rosacea and AKs - nose Lab Results  Component Value Date   WBC 11.0* 09/25/2014   HGB 13.9 09/25/2014   HCT 42.3 09/25/2014   PLT 207.0 09/25/2014   GLUCOSE 99 09/25/2014  CHOL 165 09/25/2014   TRIG 79.0 09/25/2014   HDL 41.30 09/25/2014   LDLDIRECT 145.8 07/01/2010   LDLCALC 108* 09/25/2014   ALT 20 09/25/2014   AST 21 09/25/2014   NA 140 09/25/2014   K 4.3 09/25/2014   CL 106 09/25/2014   CREATININE 0.96 09/25/2014   BUN 15 09/25/2014   CO2 25 09/25/2014   TSH 0.81 09/25/2014   PSA 0.23 09/25/2014   INR 1.0 RATIO 06/26/2006   EKG NSR No  results found.  Assessment & Plan:   Diagnoses and all orders for this visit:  Paresthesia -     US Carotid Bilateral -     Ambulatory referral to Neurology -     Basic metabolic panel; Future -     CBC with Differential/Platelet; Future -     Hepatic function panel; Future -     Lipid panel; Future -     Vitamin B12; Future -     TSH; Future -     PSA; Future -     Urinalysis; Future  Essential hypertension -     Basic metabolic panel; Future -     CBC with Differential/Platelet; Future -     Hepatic function panel; Future -     Lipid panel; Future -     Vitamin B12; Future -     TSH; Future -     PSA; Future -     Urinalysis; Future  Well adult exam -     EKG 12-Lead -     Basic metabolic panel; Future -     CBC with Differential/Platelet; Future -     Hepatic function panel; Future -     Lipid panel; Future -     Vitamin B12; Future -     TSH; Future -     PSA; Future -     Urinalysis; Future  Other orders -     benazepril (LOTENSIN) 20 MG tablet; Take one half  tablet by mouth one time daily -     aspirin (BAYER ASPIRIN) 325 MG tablet; Take 1 tablet (325 mg total) by mouth daily. -     metroNIDAZOLE (METROGEL) 1 % gel; Apply topically daily.  I am having Mr. Lastinger start on aspirin and metroNIDAZOLE. I am also having him maintain his diazepam, pravastatin, and benazepril.  Meds ordered this encounter  Medications  . benazepril (LOTENSIN) 20 MG tablet    Sig: Take one half  tablet by mouth one time daily    Dispense:  90 tablet    Refill:  3  . aspirin (BAYER ASPIRIN) 325 MG tablet    Sig: Take 1 tablet (325 mg total) by mouth daily.    Dispense:  100 tablet    Refill:  3  . metroNIDAZOLE (METROGEL) 1 % gel    Sig: Apply topically daily.    Dispense:  45 g    Refill:  0     Follow-up: Return in about 6 months (around 03/26/2016) for a follow-up visit.  Walker Kehr, MD

## 2015-09-27 NOTE — Assessment & Plan Note (Signed)
Here for medicare wellness/physical  Diet: heart healthy  Physical activity: not sedentary  Depression/mood screen: negative  Hearing: a little decreased to whispered voice  Visual acuity: grossly normal, performs annual eye exam  ADLs: capable  Fall risk: none  Home safety: good  Cognitive evaluation: intact to orientation, naming, recall and repetition  EOL planning: adv directives, full code/ I agree  I have personally reviewed and have noted  1. The patient's medical, surgical and social history  2. Their use of alcohol, tobacco or illicit drugs  3. Their current medications and supplements  4. The patient's functional ability including ADL's, fall risks, home safety risks and hearing or visual impairment.  5. Diet and physical activities  6. Evidence for depression or mood disorders 7. The roster of all physicians providing medical care to patient - is listed in the Snapshot section of the chart and reviewed today.    Today patient counseled on age appropriate routine health concerns for screening and prevention, each reviewed and up to date or declined. Immunizations reviewed and up to date or declined. Labs ordered and reviewed. Risk factors for depression reviewed and negative. Hearing function and visual acuity are intact. ADLs screened and addressed as needed. Functional ability and level of safety reviewed and appropriate. Education, counseling and referrals performed based on assessed risks today. Patient provided with a copy of personalized plan for preventive services.    

## 2015-09-27 NOTE — Progress Notes (Signed)
Pre visit review using our clinic review tool, if applicable. No additional management support is needed unless otherwise documented below in the visit note. 

## 2015-09-27 NOTE — Patient Instructions (Signed)

## 2015-09-27 NOTE — Assessment & Plan Note (Signed)
Lotensin Labs EKG

## 2015-10-01 ENCOUNTER — Telehealth: Payer: Self-pay | Admitting: Internal Medicine

## 2015-10-01 NOTE — Telephone Encounter (Signed)
Pt informed of below  Notes Recorded by Cresenciano Lick, CMA on 09/30/2015 at 1:18 PM   Left mess for patient to call back. Notes Recorded by Cassandria Anger, MD on 09/27/2015 at 5:00 PM [facility], please, inform patient that all labs are normal except for a low vit B12. It could be contributing to his sx's. OV this week to start on shots. Thx

## 2015-10-01 NOTE — Telephone Encounter (Signed)
Please call back in regards to lab work.

## 2015-10-04 ENCOUNTER — Other Ambulatory Visit (INDEPENDENT_AMBULATORY_CARE_PROVIDER_SITE_OTHER): Payer: Medicare Other

## 2015-10-04 ENCOUNTER — Ambulatory Visit (INDEPENDENT_AMBULATORY_CARE_PROVIDER_SITE_OTHER): Payer: Medicare Other | Admitting: Neurology

## 2015-10-04 ENCOUNTER — Encounter: Payer: Self-pay | Admitting: Neurology

## 2015-10-04 VITALS — BP 124/84 | HR 89 | Ht 68.0 in | Wt 198.1 lb

## 2015-10-04 DIAGNOSIS — R202 Paresthesia of skin: Secondary | ICD-10-CM | POA: Diagnosis not present

## 2015-10-04 DIAGNOSIS — R292 Abnormal reflex: Secondary | ICD-10-CM

## 2015-10-04 DIAGNOSIS — H02401 Unspecified ptosis of right eyelid: Secondary | ICD-10-CM

## 2015-10-04 DIAGNOSIS — E538 Deficiency of other specified B group vitamins: Secondary | ICD-10-CM | POA: Diagnosis not present

## 2015-10-04 DIAGNOSIS — H8102 Meniere's disease, left ear: Secondary | ICD-10-CM

## 2015-10-04 LAB — FOLATE: Folate: 9.6 ng/mL (ref >5.9)

## 2015-10-04 NOTE — Patient Instructions (Signed)
1.  Check lab testing today.   2.  Start vitamin B12 injections, as recommended by Dr. Alain Marion 3.  If you develop any new neck pain or worsening tingling sensation, please contact my office for a return visit.

## 2015-10-04 NOTE — Progress Notes (Signed)
[name] Note - Initial Visit   Date: 10/04/2015  PHELIX Rowe MRN: [id number] DOB: Dec 01, 1945   Dear Dr. Alain Marion:  Thank you for your kind referral of John Rowe for consultation of paresthesias. Although his history is well known to you, please allow [name] to reiterate it for the purpose of our medical record. The patient was accompanied to the clinic by self.    History of Present Illness: John Rowe is a 70 y.o. right-handed Caucasian male with hypertension, hyperlipidemia, former tobacco use, and meniere's disease presenting for evaluation of transient paresthesias of the leg and abnormal sensation of the head.    He also complains of fullness of the head, as if it was a change in air pressure.  He was diagnosed with Meniere's syndrome several years ago and has chronic issues with tinnitus.  He has noticed that associated with the fullness of his ears, soon thereafter he experiences tingling sensation over the right lateral thigh, lasting about 5 seconds.  It involves a very localized areas of the thigh, about 3-4 inches, but does not extend medially, posterior, or past the knee.  Over the past year, it has reduced in frequency to once every few months, previously occuring about once per week.  No associated weakness.  He recalls having a similar sensation over the right arm at times, again very brief.  Denies any headache, vision changes, neck pain, or persistent numbness/tingling.  He does endorse right ptosis, which has been present for the past year.  There is no diurnal variation.  He recently had an annual exam and mentioned this symptoms to Dr. Alain Marion who ordered vitamin B12 and came back low.  He is scheduled for a follow-up visit and to start injections later this week.  Out-side paper records, electronic medical record, and images have been reviewed where available and summarized as:  Lab Results  Component Value Date   TSH  1.02 09/27/2015   Lab Results  Component Value Date   VITAMINB12 193* 09/27/2015   MRI lumbar spine wo contrast 09/08/2009: 1. Lumbar spondylosis and degenerative disc disease causing multilevel stenosis as detailed above. 2. T2 hyperintense, T1 hypointense renal lesions are statistically likely to represent cysts but are not fully evaluated on today's lumbar spine MRI.  Past Medical History  Diagnosis Date  . Chest pain   . Meniere disease   . Hearing loss of left ear   . Hypertension   . Hyperlipidemia     Past Surgical History  Procedure Laterality Date  . Colonoscopy       Medications:  Outpatient Encounter Prescriptions as of 10/04/2015  Medication Sig  . aspirin (BAYER ASPIRIN) 325 MG tablet Take 1 tablet (325 mg total) by mouth daily.  . benazepril (LOTENSIN) 20 MG tablet Take one half  tablet by mouth one time daily  . diazepam (VALIUM) 5 MG tablet Take one tablet by mouth daily as needed  . metroNIDAZOLE (METROGEL) 1 % gel Apply topically daily.  . pravastatin (PRAVACHOL) 20 MG tablet Take 1 tablet (20 mg total) by mouth daily.   No facility-administered encounter medications on file as of 10/04/2015.     Allergies:  Allergies  Allergen Reactions  . Oxycodone-Acetaminophen     REACTION: constipation  . Tetracycline Hcl     REACTION: rash    Family History: Family History  Problem Relation Age of Onset  . Adopted: Yes  . Hypertension    . Stroke    .  Cancer Sister     brast ca  . Cancer Brother     pancreatic ca    Social History: Social History  Substance Use Topics  . Smoking status: Former Smoker    Quit date: 09/12/1979  . Smokeless tobacco: Never Used  . Alcohol Use: 0.0 oz/week    0 Standard drinks or equivalent per week   Social History   Social History Narrative   Lives with wife in a 4 story home.  Has 3 children and 7 grandkids.  Retired from [employer].  Education: some college.      Review of Systems:  CONSTITUTIONAL: No  fevers, chills, night sweats, or weight loss.   EYES: No visual changes or eye pain ENT: No hearing changes.  No history of nose bleeds.   RESPIRATORY: No cough, wheezing and shortness of breath.   CARDIOVASCULAR: Negative for chest pain, and palpitations.   GI: Negative for abdominal discomfort, blood in stools or black stools.  No recent change in bowel habits.   GU:  No history of incontinence.   MUSCLOSKELETAL: No history of joint pain or swelling.  No myalgias.   SKIN: Negative for lesions, rash, and itching.   HEMATOLOGY/ONCOLOGY: Negative for prolonged bleeding, bruising easily, and swollen nodes.  No history of cancer.   ENDOCRINE: Negative for cold or heat intolerance, polydipsia or goiter.   PSYCH:  No depression or anxiety symptoms.   NEURO: As Above.   Vital Signs:  BP 124/84 mmHg  Pulse 89  Ht 5\' 8"  (1.727 m)  Wt 198 lb 1 oz (89.841 kg)  BMI 30.12 kg/m2  SpO2 98% Pain Scale: 0 on a scale of 0-10   General Medical Exam:   General:  Well appearing, comfortable.   Eyes/ENT: see cranial nerve examination.   Neck: No masses appreciated.  Full range of motion without tenderness.  No carotid bruits. Respiratory:  Clear to auscultation, good air entry bilaterally.   Cardiac:  Regular rate and rhythm, no murmur.   Extremities:  No deformities, edema, or skin discoloration.  Skin:  No rashes or lesions.  Neurological Exam: MENTAL STATUS including orientation to time, place, person, recent and remote memory, attention span and concentration, language, and fund of knowledge is normal.  Speech is not dysarthric.  CRANIAL NERVES: II:  No visual field defects.  Unremarkable fundi.   III-IV-VI: Pupils equal round and reactive to light.  Normal conjugate, extra-ocular eye movements in all directions of gaze.  No nystagmus.  Moderate right ptosis without worsening with sustained upgaze.   V:  Normal facial sensation.     VII:  Normal facial symmetry and movements.  No pathologic  facial reflexes.  VIII:  Normal hearing and vestibular function.   IX-X:  Normal palatal movement.   XI:  Normal shoulder shrug and head rotation.   XII:  Normal tongue strength and range of motion, no deviation or fasciculation.  MOTOR:  No atrophy, fasciculations or abnormal movements.  No pronator drift.  Tone is normal.    Right Upper Extremity:    Left Upper Extremity:    Deltoid  5/5   Deltoid  5/5   Biceps  5/5   Biceps  5/5   Triceps  5/5   Triceps  5/5   Wrist extensors  5/5   Wrist extensors  5/5   Wrist flexors  5/5   Wrist flexors  5/5   Finger extensors  5/5   Finger extensors  5/5   Finger flexors  5/5   Finger flexors  5/5   Dorsal interossei  5/5   Dorsal interossei  5/5   Abductor pollicis  5/5   Abductor pollicis  5/5   Tone (Ashworth scale)  0  Tone (Ashworth scale)  0   Right Lower Extremity:    Left Lower Extremity:    Hip flexors  5/5   Hip flexors  5/5   Hip extensors  5/5   Hip extensors  5/5   Knee flexors  5/5   Knee flexors  5/5   Knee extensors  5/5   Knee extensors  5/5   Dorsiflexors  5/5   Dorsiflexors  5/5   Plantarflexors  5/5   Plantarflexors  5/5   Toe extensors  5/5   Toe extensors  5/5   Toe flexors  5/5   Toe flexors  5/5   Tone (Ashworth scale)  0  Tone (Ashworth scale)  0   MSRs:  Right                                                                 Left brachioradialis 3+  brachioradialis 3+  biceps 3+  biceps 3+  triceps 3+  triceps 3+  patellar 3+  patellar 3+  ankle jerk 2+  ankle jerk 2+  Hoffman no  Hoffman no  plantar response down  plantar response down   SENSORY:  Normal and symmetric perception of light touch, pinprick, vibration, and proprioception.  Romberg's sign absent.   COORDINATION/GAIT: Normal finger-to- nose-finger and heel-to-shin.  Intact rapid alternating movements bilaterally.  Able to rise from a chair without using arms.  Gait narrow based and stable.  Difficulty with heel walking due to pain (plantar  fasciitis), toe walking and tandem gait intact.   IMPRESSION: 1.  Transient nonspecific paresthesias of the right lateral thigh which maybe due to vitamin B12 deficiency.  Recommend treating underlying vitamin B12 deficiency to see if this improves any of his symptoms. 2.  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  I will also screen for nutritional deficiencies that may cause myelopathy  3.  Right ptosis, check MG antibodies 4.  Meniere's syndrome, stable  PLAN/RECOMMENDATIONS:  1.  Check myasthenia gravis antibodies, folate, copper, zinc, vitamin B1 2.  Start vitamin B12 injections 3.  MRI cervical spine declined.  If he develops any new neck pain or worsening paresthesias, low threshold to obtain cervical imaging given his generalized hyperreflexia 4.  Return visit declined, patient will contact our office if symptoms persist   The duration of this appointment visit was 40 minutes of face-to-face time with the patient.  Greater than 50% of this time was spent in counseling, explanation of diagnosis, planning of further management, and coordination of care.   Thank you for allowing me to participate in patient's care.  If I can answer any additional questions, I would be pleased to do so.    Sincerely,    Donika K. Posey Pronto, DO

## 2015-10-07 ENCOUNTER — Ambulatory Visit (INDEPENDENT_AMBULATORY_CARE_PROVIDER_SITE_OTHER): Payer: Medicare Other | Admitting: Internal Medicine

## 2015-10-07 ENCOUNTER — Encounter: Payer: Self-pay | Admitting: Internal Medicine

## 2015-10-07 VITALS — BP 110/78 | HR 86 | Wt 198.0 lb

## 2015-10-07 DIAGNOSIS — E538 Deficiency of other specified B group vitamins: Secondary | ICD-10-CM

## 2015-10-07 LAB — COPPER, SERUM: COPPER: 97 ug/dL (ref 70–175)

## 2015-10-07 LAB — ZINC: Zinc: 61 ug/dL (ref 60–130)

## 2015-10-07 MED ORDER — CYANOCOBALAMIN 1000 MCG/ML IJ SOLN: INTRAMUSCULAR | Status: DC

## 2015-10-07 MED ORDER — "SYRINGE/NEEDLE (DISP) 30G X 1/2"" 1 ML MISC": Status: DC

## 2015-10-07 MED ORDER — CYANOCOBALAMIN 1000 MCG/ML IJ SOLN
1000.0000 ug | Freq: Once | INTRAMUSCULAR | Status: AC
  Administered 2015-10-07: 1000 ug via INTRAMUSCULAR

## 2015-10-07 NOTE — Progress Notes (Signed)
Pre visit review using our clinic review tool, if applicable. No additional management support is needed unless otherwise documented below in the visit note. 

## 2015-10-07 NOTE — Assessment & Plan Note (Addendum)
1/17 options to treat discussed - start sq B12

## 2015-10-07 NOTE — Progress Notes (Signed)
   Subjective:  Patient ID: John Rowe, male    DOB: Sep 15, 1945  Age: 70 y.o. MRN: [id number]  CC: No chief complaint on file.   HPI John Rowe presents for Vit B12 def  Outpatient Prescriptions Prior to Visit  Medication Sig Dispense Refill  . aspirin (BAYER ASPIRIN) 325 MG tablet Take 1 tablet (325 mg total) by mouth daily. 100 tablet 3  . benazepril (LOTENSIN) 20 MG tablet Take one half  tablet by mouth one time daily 90 tablet 3  . diazepam (VALIUM) 5 MG tablet Take one tablet by mouth daily as needed 30 tablet 3  . metroNIDAZOLE (METROGEL) 1 % gel Apply topically daily. 45 g 0  . pravastatin (PRAVACHOL) 20 MG tablet Take 1 tablet (20 mg total) by mouth daily. 30 tablet 11   No facility-administered medications prior to visit.    ROS Review of Systems  Constitutional: Negative for fatigue.  Musculoskeletal: Positive for arthralgias.  Skin: Negative for rash.  Neurological: Negative for weakness.    Objective:  BP 110/78 mmHg  Pulse 86  Wt 198 lb (89.812 kg)  SpO2 97%  BP Readings from Last 3 Encounters:  10/07/15 110/78  10/04/15 124/84  09/27/15 120/84    Wt Readings from Last 3 Encounters:  10/07/15 198 lb (89.812 kg)  10/04/15 198 lb 1 oz (89.841 kg)  09/27/15 200 lb (90.719 kg)    Physical Exam  Constitutional: No distress.  Pulmonary/Chest: He has no rales.  Abdominal: There is no tenderness.  Musculoskeletal: He exhibits no tenderness.  Neurological: He exhibits normal muscle tone.    Lab Results  Component Value Date   WBC 9.9 09/27/2015   HGB 14.3 09/27/2015   HCT 43.1 09/27/2015   PLT 200.0 09/27/2015   GLUCOSE 104* 09/27/2015   CHOL 171 09/27/2015   TRIG 93.0 09/27/2015   HDL 56.50 09/27/2015   LDLDIRECT 145.8 07/01/2010   LDLCALC 96 09/27/2015   ALT 15 09/27/2015   AST 18 09/27/2015   NA 140 09/27/2015   K 4.5 09/27/2015   CL 103 09/27/2015   CREATININE 1.05 09/27/2015   BUN 9 09/27/2015   CO2 25 09/27/2015   TSH 1.02  09/27/2015   PSA 0.21 09/27/2015   INR 1.0 RATIO 06/26/2006    No results found.  Assessment & Plan:   There are no diagnoses linked to this encounter. I am having [name] maintain his diazepam, pravastatin, benazepril, aspirin, and metroNIDAZOLE.  No orders of the defined types were placed in this encounter.     Follow-up: No Follow-up on file.  Walker Kehr, MD

## 2015-10-08 LAB — VITAMIN B1: VITAMIN B1 (THIAMINE): 11 nmol/L (ref 8–30)

## 2015-10-11 LAB — MYASTHENIA GRAVIS PANEL 2: Acetylcholine Rec Mod Ab: 15 % binding inhibition

## 2015-10-18 ENCOUNTER — Other Ambulatory Visit: Payer: Self-pay | Admitting: Internal Medicine

## 2016-03-27 ENCOUNTER — Other Ambulatory Visit (INDEPENDENT_AMBULATORY_CARE_PROVIDER_SITE_OTHER): Payer: Medicare Other

## 2016-03-27 ENCOUNTER — Encounter: Payer: Self-pay | Admitting: Internal Medicine

## 2016-03-27 ENCOUNTER — Ambulatory Visit (INDEPENDENT_AMBULATORY_CARE_PROVIDER_SITE_OTHER): Payer: Medicare Other | Admitting: Internal Medicine

## 2016-03-27 VITALS — BP 110/70 | HR 76 | Wt 198.0 lb

## 2016-03-27 DIAGNOSIS — R21 Rash and other nonspecific skin eruption: Secondary | ICD-10-CM | POA: Insufficient documentation

## 2016-03-27 DIAGNOSIS — E538 Deficiency of other specified B group vitamins: Secondary | ICD-10-CM | POA: Diagnosis not present

## 2016-03-27 DIAGNOSIS — I10 Essential (primary) hypertension: Secondary | ICD-10-CM

## 2016-03-27 DIAGNOSIS — G459 Transient cerebral ischemic attack, unspecified: Secondary | ICD-10-CM

## 2016-03-27 DIAGNOSIS — H6123 Impacted cerumen, bilateral: Secondary | ICD-10-CM

## 2016-03-27 DIAGNOSIS — E785 Hyperlipidemia, unspecified: Secondary | ICD-10-CM | POA: Diagnosis not present

## 2016-03-27 DIAGNOSIS — H8109 Meniere's disease, unspecified ear: Secondary | ICD-10-CM | POA: Diagnosis not present

## 2016-03-27 DIAGNOSIS — R202 Paresthesia of skin: Secondary | ICD-10-CM

## 2016-03-27 LAB — VITAMIN B12: Vitamin B-12: 840 pg/mL (ref 211–911)

## 2016-03-27 LAB — BASIC METABOLIC PANEL
BUN: 18 mg/dL (ref 6–23)
CHLORIDE: 104 meq/L (ref 96–112)
CO2: 26 mEq/L (ref 19–32)
CREATININE: 1.14 mg/dL (ref 0.40–1.50)
Calcium: 9.2 mg/dL (ref 8.4–10.5)
GFR: 67.02 mL/min (ref >60.00)
Glucose, Bld: 122 mg/dL — ABNORMAL HIGH (ref 70–99)
Potassium: 4.1 mEq/L (ref 3.5–5.1)
Sodium: 136 mEq/L (ref 135–145)

## 2016-03-27 MED ORDER — VITAMIN C 100 MG PO TABS: 100.0000 mg | ORAL_TABLET | Freq: Every day | ORAL | Status: DC

## 2016-03-27 NOTE — Assessment & Plan Note (Signed)
Carot Doppler to re-order - not done yet

## 2016-03-27 NOTE — Assessment & Plan Note (Signed)
On Lotensin 

## 2016-03-27 NOTE — Assessment & Plan Note (Signed)
He will irrigate at home 

## 2016-03-27 NOTE — Assessment & Plan Note (Signed)
No relapse lately 

## 2016-03-27 NOTE — Progress Notes (Signed)
Pre visit review using our clinic review tool, if applicable. No additional management support is needed unless otherwise documented below in the visit note. 

## 2016-03-27 NOTE — Assessment & Plan Note (Signed)
L flank  Dry rash - lamisil cream helped in the past (Dr Arnoldo Morale) Re-start Lamisil cream

## 2016-03-27 NOTE — Progress Notes (Signed)
Subjective:  Patient ID: John Rowe, male    DOB: 1946-04-08  Age: 70 y.o. MRN: [id number]  CC: No chief complaint on file.   HPI John Rowe presents for B12 def - no change in sx's C/o chills - not often on the R side C/o rash on L flank - Dr Arnoldo Morale treated it with topical Lamisil  Outpatient Prescriptions Prior to Visit  Medication Sig Dispense Refill  . aspirin (BAYER ASPIRIN) 325 MG tablet Take 1 tablet (325 mg total) by mouth daily. 100 tablet 3  . benazepril (LOTENSIN) 20 MG tablet TAKE HALF TABLET BY MOUTH DAILY 135 tablet 3  . cyanocobalamin (COBAL-1000) 1000 MCG/ML injection Use weekly x 1 month then monthly sq 10 mL 11  . diazepam (VALIUM) 5 MG tablet Take one tablet by mouth daily as needed 30 tablet 3  . metroNIDAZOLE (METROGEL) 1 % gel Apply topically daily. 45 g 0  . pravastatin (PRAVACHOL) 20 MG tablet Take 1 tablet (20 mg total) by mouth daily. 30 tablet 11  . Syringe/Needle, Disp, (B-D ECLIPSE SYRINGE) 30G X 1/2" 1 ML MISC Use sq for B12 injections 50 each 11   No facility-administered medications prior to visit.    ROS Review of Systems  Constitutional: Negative for appetite change, fatigue and unexpected weight change.  HENT: Negative for congestion, ear pain, nosebleeds, sneezing, sore throat and trouble swallowing.   Eyes: Negative for itching and visual disturbance.  Respiratory: Negative for cough.   Cardiovascular: Negative for chest pain, palpitations and leg swelling.  Gastrointestinal: Negative for nausea, diarrhea, blood in stool and abdominal distention.  Genitourinary: Negative for frequency and hematuria.  Musculoskeletal: Negative for back pain, joint swelling, gait problem and neck pain.  Skin: Positive for rash.  Neurological: Negative for dizziness, tremors, speech difficulty and weakness.  Psychiatric/Behavioral: Negative for sleep disturbance, dysphoric mood and agitation. The patient is not nervous/anxious.     Objective:  BP  110/70 mmHg  Pulse 76  Wt 198 lb (89.812 kg)  SpO2 97%  BP Readings from Last 3 Encounters:  03/27/16 110/70  10/07/15 110/78  10/04/15 124/84    Wt Readings from Last 3 Encounters:  03/27/16 198 lb (89.812 kg)  10/07/15 198 lb (89.812 kg)  10/04/15 198 lb 1 oz (89.841 kg)    Physical Exam  Constitutional: He is oriented to person, place, and time. He appears well-developed. No distress.  NAD  HENT:  Mouth/Throat: Oropharynx is clear and moist.  Eyes: Conjunctivae are normal. Pupils are equal, round, and reactive to light.  Neck: Normal range of motion. No JVD present. No thyromegaly present.  Cardiovascular: Normal rate, regular rhythm, normal heart sounds and intact distal pulses.  Exam reveals no gallop and no friction rub.   No murmur heard. Pulmonary/Chest: Effort normal and breath sounds normal. No respiratory distress. He has no wheezes. He has no rales. He exhibits no tenderness.  Abdominal: Soft. Bowel sounds are normal. He exhibits no distension and no mass. There is no tenderness. There is no rebound and no guarding.  Musculoskeletal: Normal range of motion. He exhibits no edema or tenderness.  Lymphadenopathy:    He has no cervical adenopathy.  Neurological: He is alert and oriented to person, place, and time. He has normal reflexes. No cranial nerve deficit. He exhibits normal muscle tone. He displays a negative Romberg sign. Coordination and gait normal.  Skin: Skin is warm and dry. No rash noted.  Psychiatric: He has a normal mood and affect.  His behavior is normal. Judgment and thought content normal.  l flank with dry rash B ear wax Mild forearm bruising  Lab Results  Component Value Date   WBC 9.9 09/27/2015   HGB 14.3 09/27/2015   HCT 43.1 09/27/2015   PLT 200.0 09/27/2015   GLUCOSE 104* 09/27/2015   CHOL 171 09/27/2015   TRIG 93.0 09/27/2015   HDL 56.50 09/27/2015   LDLDIRECT 145.8 07/01/2010   LDLCALC 96 09/27/2015   ALT 15 09/27/2015   AST 18  09/27/2015   NA 140 09/27/2015   K 4.5 09/27/2015   CL 103 09/27/2015   CREATININE 1.05 09/27/2015   BUN 9 09/27/2015   CO2 25 09/27/2015   TSH 1.02 09/27/2015   PSA 0.21 09/27/2015   INR 1.0 RATIO 06/26/2006    No results found.  Assessment & Plan:   Diagnoses and all orders for this visit:  Vitamin B12 deficiency  Dyslipidemia  Other orders -     Ascorbic Acid (VITAMIN C) 100 MG tablet; Take 1 tablet (100 mg total) by mouth daily. For bruising   I am having John Rowe start on vitamin C. I am also having him maintain his diazepam, pravastatin, aspirin, metroNIDAZOLE, cyanocobalamin, Syringe/Needle (Disp), and benazepril.  Meds ordered this encounter  Medications  . Ascorbic Acid (VITAMIN C) 100 MG tablet    Sig: Take 1 tablet (100 mg total) by mouth daily. For bruising    Dispense:  100 tablet    Refill:  3     Follow-up: No Follow-up on file.  Walker Kehr, MD

## 2016-03-27 NOTE — Assessment & Plan Note (Signed)
On B12 

## 2016-03-27 NOTE — Assessment & Plan Note (Signed)
On Pravachol 

## 2016-04-16 ENCOUNTER — Other Ambulatory Visit: Payer: Self-pay | Admitting: Internal Medicine

## 2016-05-11 ENCOUNTER — Ambulatory Visit (HOSPITAL_COMMUNITY)
Admission: RE | Admit: 2016-05-11 | Discharge: 2016-05-11 | Disposition: A | Discharge status: Home / Self Care | Payer: Medicare Other | Source: Ambulatory Visit | Attending: Cardiology | Admitting: Cardiology

## 2016-05-11 DIAGNOSIS — R202 Paresthesia of skin: Secondary | ICD-10-CM | POA: Insufficient documentation

## 2016-05-11 DIAGNOSIS — I6523 Occlusion and stenosis of bilateral carotid arteries: Secondary | ICD-10-CM

## 2016-05-11 DIAGNOSIS — I10 Essential (primary) hypertension: Secondary | ICD-10-CM | POA: Diagnosis not present

## 2016-05-11 DIAGNOSIS — Z87891 Personal history of nicotine dependence: Secondary | ICD-10-CM | POA: Insufficient documentation

## 2016-05-11 DIAGNOSIS — E785 Hyperlipidemia, unspecified: Secondary | ICD-10-CM | POA: Diagnosis not present

## 2016-05-11 DIAGNOSIS — R209 Unspecified disturbances of skin sensation: Secondary | ICD-10-CM

## 2016-06-06 ENCOUNTER — Telehealth: Payer: Self-pay | Admitting: Emergency Medicine

## 2016-06-06 NOTE — Telephone Encounter (Signed)
Use over-the-counter  "cold" medicines  such as  "Afrin" nasal spray for nasal congestion as directed instead. Use " Robitussin" cough syrup varietis for cough.  You can use plain "Tylenol" or "Advil" for fever, chills and achyness. Use Halls or Ricola cough drops.   Please, make an appointment if worse.

## 2016-06-06 NOTE — Telephone Encounter (Signed)
Pt called and is congested and also has a cough with mucous. It has been going on about 3 weeks now. He was wondering if Dr John Rowe could recommend something so he doesn't have to make an appt. He has already taken a bottle on mucinex but it didn't help. Please advise thanks.

## 2016-06-07 NOTE — Telephone Encounter (Signed)
Left mess for patient to call back.  

## 2016-06-08 NOTE — Telephone Encounter (Signed)
Patient called back.  I gave him MD response.

## 2016-09-25 ENCOUNTER — Other Ambulatory Visit (INDEPENDENT_AMBULATORY_CARE_PROVIDER_SITE_OTHER): Payer: Medicare Other

## 2016-09-25 ENCOUNTER — Encounter: Payer: Self-pay | Admitting: Internal Medicine

## 2016-09-25 ENCOUNTER — Ambulatory Visit (INDEPENDENT_AMBULATORY_CARE_PROVIDER_SITE_OTHER): Payer: Medicare Other | Admitting: Internal Medicine

## 2016-09-25 VITALS — BP 130/78 | HR 62 | Temp 97.6°F | Wt 194.0 lb

## 2016-09-25 DIAGNOSIS — H8109 Meniere's disease, unspecified ear: Secondary | ICD-10-CM | POA: Diagnosis not present

## 2016-09-25 DIAGNOSIS — I10 Essential (primary) hypertension: Secondary | ICD-10-CM | POA: Diagnosis not present

## 2016-09-25 DIAGNOSIS — E538 Deficiency of other specified B group vitamins: Secondary | ICD-10-CM | POA: Diagnosis not present

## 2016-09-25 DIAGNOSIS — Z23 Encounter for immunization: Secondary | ICD-10-CM | POA: Diagnosis not present

## 2016-09-25 LAB — CBC WITH DIFFERENTIAL/PLATELET
BASOS PCT: 1 % (ref 0.0–3.0)
Basophils Absolute: 0.1 10*3/uL (ref 0.0–0.1)
EOS ABS: 0.2 10*3/uL (ref 0.0–0.7)
EOS PCT: 2.2 % (ref 0.0–5.0)
HEMATOCRIT: 40.7 % (ref 39.0–52.0)
HEMOGLOBIN: 14.1 g/dL (ref 13.0–17.0)
Lymphocytes Relative: 28.6 % (ref 12.0–46.0)
Lymphs Abs: 2.8 10*3/uL (ref 0.7–4.0)
MCHC: 34.6 g/dL (ref 30.0–36.0)
MCV: 95.4 fl (ref 78.0–100.0)
MONO ABS: 1.1 10*3/uL — AB (ref 0.1–1.0)
Monocytes Relative: 10.7 % (ref 3.0–12.0)
NEUTROS ABS: 5.7 10*3/uL (ref 1.4–7.7)
Neutrophils Relative %: 57.5 % (ref 43.0–77.0)
PLATELETS: 195 10*3/uL (ref 150.0–400.0)
RBC: 4.26 Mil/uL (ref 4.22–5.81)
RDW: 13.5 % (ref 11.5–15.5)
WBC: 9.9 10*3/uL (ref 4.0–10.5)

## 2016-09-25 LAB — BASIC METABOLIC PANEL
BUN: 14 mg/dL (ref 6–23)
CALCIUM: 9 mg/dL (ref 8.4–10.5)
CO2: 28 mEq/L (ref 19–32)
Chloride: 104 mEq/L (ref 96–112)
Creatinine, Ser: 0.95 mg/dL (ref 0.40–1.50)
GFR: 82.6 mL/min (ref >60.00)
GLUCOSE: 123 mg/dL — AB (ref 70–99)
Potassium: 4 mEq/L (ref 3.5–5.1)
SODIUM: 138 meq/L (ref 135–145)

## 2016-09-25 LAB — VITAMIN B12: VITAMIN B 12: 427 pg/mL (ref 211–911)

## 2016-09-25 MED ORDER — DIAZEPAM 5 MG PO TABS: 5.0000 mg | ORAL_TABLET | Freq: Every day | ORAL | 3 refills | Status: DC | PRN

## 2016-09-25 MED ORDER — CLINDAMYCIN PHOSPHATE 1 % EX LOTN: TOPICAL_LOTION | Freq: Two times a day (BID) | CUTANEOUS | 5 refills | Status: DC

## 2016-09-25 NOTE — Progress Notes (Signed)
Pre visit review using our clinic review tool, if applicable. No additional management support is needed unless otherwise documented below in the visit note. 

## 2016-09-25 NOTE — Progress Notes (Signed)
Subjective:  Patient ID: John Rowe, male    DOB: Jul 03, 1946  Age: 71 y.o. MRN: [id number]  CC: No chief complaint on file.   HPI KEMAURY QUANCE presents for HTN, rosacea, B12 def f/u  Outpatient Medications Prior to Visit  Medication Sig Dispense Refill  . aspirin (BAYER ASPIRIN) 325 MG tablet Take 1 tablet (325 mg total) by mouth daily. 100 tablet 3  . benazepril (LOTENSIN) 20 MG tablet TAKE HALF TABLET BY MOUTH DAILY 135 tablet 3  . diazepam (VALIUM) 5 MG tablet Take one tablet by mouth daily as needed 30 tablet 3  . metroNIDAZOLE (METROGEL) 1 % gel Apply topically daily. 45 g 0  . pravastatin (PRAVACHOL) 20 MG tablet TAKE 1 TABLET BY MOUTH DAILY 30 tablet 11  . Ascorbic Acid (VITAMIN C) 100 MG tablet Take 1 tablet (100 mg total) by mouth daily. For bruising (Patient not taking: Reported on 09/25/2016) 100 tablet 3  . cyanocobalamin (COBAL-1000) 1000 MCG/ML injection Use weekly x 1 month then monthly sq (Patient not taking: Reported on 09/25/2016) 10 mL 11  . Syringe/Needle, Disp, (B-D ECLIPSE SYRINGE) 30G X 1/2" 1 ML MISC Use sq for B12 injections (Patient not taking: Reported on 09/25/2016) 50 each 11   No facility-administered medications prior to visit.     ROS Review of Systems  Constitutional: Negative for appetite change, fatigue and unexpected weight change.  HENT: Negative for congestion, nosebleeds, sneezing, sore throat and trouble swallowing.   Eyes: Negative for itching and visual disturbance.  Respiratory: Negative for cough.   Cardiovascular: Negative for chest pain, palpitations and leg swelling.  Gastrointestinal: Negative for abdominal distention, blood in stool, diarrhea and nausea.  Genitourinary: Negative for frequency and hematuria.  Musculoskeletal: Negative for back pain, gait problem, joint swelling and neck pain.  Skin: Positive for rash.  Neurological: Negative for dizziness, tremors, speech difficulty and weakness.  Psychiatric/Behavioral: Negative  for agitation, dysphoric mood and sleep disturbance. The patient is not nervous/anxious.     Objective:  BP 130/78   Pulse 62   Temp 97.6 F (36.4 C) (Oral)   Wt 194 lb (88 kg)   SpO2 98%   BMI 29.50 kg/m   BP Readings from Last 3 Encounters:  09/25/16 130/78  03/27/16 110/70  10/07/15 110/78    Wt Readings from Last 3 Encounters:  09/25/16 194 lb (88 kg)  03/27/16 198 lb (89.8 kg)  10/07/15 198 lb (89.8 kg)    Physical Exam  Constitutional: He is oriented to person, place, and time. He appears well-developed. No distress.  NAD  HENT:  Mouth/Throat: Oropharynx is clear and moist.  Eyes: Conjunctivae are normal. Pupils are equal, round, and reactive to light.  Neck: Normal range of motion. No JVD present. No thyromegaly present.  Cardiovascular: Normal rate, regular rhythm, normal heart sounds and intact distal pulses.  Exam reveals no gallop and no friction rub.   No murmur heard. Pulmonary/Chest: Effort normal and breath sounds normal. No respiratory distress. He has no wheezes. He has no rales. He exhibits no tenderness.  Abdominal: Soft. Bowel sounds are normal. He exhibits no distension and no mass. There is no tenderness. There is no rebound and no guarding.  Musculoskeletal: Normal range of motion. He exhibits no edema or tenderness.  Lymphadenopathy:    He has no cervical adenopathy.  Neurological: He is alert and oriented to person, place, and time. He has normal reflexes. No cranial nerve deficit. He exhibits normal muscle tone. He displays  a negative Romberg sign. Coordination and gait normal.  Skin: Skin is warm and dry. No rash noted.  Psychiatric: He has a normal mood and affect. His behavior is normal. Judgment and thought content normal.    Lab Results  Component Value Date   WBC 9.9 09/27/2015   HGB 14.3 09/27/2015   HCT 43.1 09/27/2015   PLT 200.0 09/27/2015   GLUCOSE 122 (H) 03/27/2016   CHOL 171 09/27/2015   TRIG 93.0 09/27/2015   HDL 56.50  09/27/2015   LDLDIRECT 145.8 07/01/2010   LDLCALC 96 09/27/2015   ALT 15 09/27/2015   AST 18 09/27/2015   NA 136 03/27/2016   K 4.1 03/27/2016   CL 104 03/27/2016   CREATININE 1.14 03/27/2016   BUN 18 03/27/2016   CO2 26 03/27/2016   TSH 1.02 09/27/2015   PSA 0.21 09/27/2015   INR 1.0 RATIO 06/26/2006    No results found.  Assessment & Plan:   There are no diagnoses linked to this encounter. I am having Mr. Rivette maintain his diazepam, aspirin, metroNIDAZOLE, cyanocobalamin, Syringe/Needle (Disp), benazepril, vitamin C, and pravastatin.  No orders of the defined types were placed in this encounter.    Follow-up: No Follow-up on file.  Walker Kehr, MD

## 2016-09-25 NOTE — Assessment & Plan Note (Signed)
Lotensin

## 2016-09-25 NOTE — Assessment & Plan Note (Addendum)
Not taking B12 for ?reason Labs

## 2016-09-25 NOTE — Assessment & Plan Note (Signed)
Diazepam prn 

## 2016-09-25 NOTE — Addendum Note (Signed)
Addended by: Cresenciano Lick on: 09/25/2016 11:53 AM   Modules accepted: Orders

## 2016-10-18 ENCOUNTER — Other Ambulatory Visit: Payer: Self-pay | Admitting: Internal Medicine

## 2017-04-02 ENCOUNTER — Encounter: Payer: Self-pay | Admitting: Internal Medicine

## 2017-04-02 ENCOUNTER — Ambulatory Visit (INDEPENDENT_AMBULATORY_CARE_PROVIDER_SITE_OTHER): Payer: Medicare Other | Admitting: Internal Medicine

## 2017-04-02 ENCOUNTER — Other Ambulatory Visit (INDEPENDENT_AMBULATORY_CARE_PROVIDER_SITE_OTHER): Payer: Medicare Other

## 2017-04-02 VITALS — BP 124/68 | HR 80 | Temp 98.1°F | Ht 68.0 in | Wt 198.0 lb

## 2017-04-02 DIAGNOSIS — I10 Essential (primary) hypertension: Secondary | ICD-10-CM

## 2017-04-02 DIAGNOSIS — L719 Rosacea, unspecified: Secondary | ICD-10-CM | POA: Diagnosis not present

## 2017-04-02 DIAGNOSIS — E785 Hyperlipidemia, unspecified: Secondary | ICD-10-CM | POA: Diagnosis not present

## 2017-04-02 DIAGNOSIS — E538 Deficiency of other specified B group vitamins: Secondary | ICD-10-CM

## 2017-04-02 DIAGNOSIS — R7309 Other abnormal glucose: Secondary | ICD-10-CM | POA: Diagnosis not present

## 2017-04-02 LAB — BASIC METABOLIC PANEL
BUN: 17 mg/dL (ref 6–23)
CO2: 25 meq/L (ref 19–32)
Calcium: 9.1 mg/dL (ref 8.4–10.5)
Chloride: 102 mEq/L (ref 96–112)
Creatinine, Ser: 1.06 mg/dL (ref 0.40–1.50)
GFR: 72.68 mL/min (ref >60.00)
GLUCOSE: 149 mg/dL — AB (ref 70–99)
POTASSIUM: 4.1 meq/L (ref 3.5–5.1)
SODIUM: 136 meq/L (ref 135–145)

## 2017-04-02 LAB — HEMOGLOBIN A1C: Hgb A1c MFr Bld: 5.5 % (ref 4.6–6.5)

## 2017-04-02 NOTE — Assessment & Plan Note (Signed)
On B12 

## 2017-04-02 NOTE — Assessment & Plan Note (Signed)
On Metronidazole on the skin

## 2017-04-02 NOTE — Assessment & Plan Note (Signed)
Pravachol

## 2017-04-02 NOTE — Progress Notes (Signed)
Subjective:  Patient ID: John Rowe, male    DOB: 03-May-1946  Age: 70 y.o. MRN: [id number]  CC: No chief complaint on file.   HPI John Rowe presents for HTN, B12 def, dyslipidemia f/u. Pt refused Shingrix  Outpatient Medications Prior to Visit  Medication Sig Dispense Refill  . Ascorbic Acid (VITAMIN C) 100 MG tablet Take 1 tablet (100 mg total) by mouth daily. For bruising 100 tablet 3  . aspirin (BAYER ASPIRIN) 325 MG tablet Take 1 tablet (325 mg total) by mouth daily. 100 tablet 3  . benazepril (LOTENSIN) 20 MG tablet TAKE ONE-HALF TABLET BY MOUTH DAILY 135 tablet 1  . clindamycin (CLEOCIN T) 1 % lotion Apply topically 2 (two) times daily. On face 60 mL 5  . cyanocobalamin (COBAL-1000) 1000 MCG/ML injection Use weekly x 1 month then monthly sq 10 mL 11  . diazepam (VALIUM) 5 MG tablet Take 1 tablet (5 mg total) by mouth daily as needed. 30 tablet 3  . metroNIDAZOLE (METROGEL) 1 % gel Apply topically daily. 45 g 0  . pravastatin (PRAVACHOL) 20 MG tablet TAKE 1 TABLET BY MOUTH DAILY 30 tablet 11  . Syringe/Needle, Disp, (B-D ECLIPSE SYRINGE) 30G X 1/2" 1 ML MISC Use sq for B12 injections 50 each 11   No facility-administered medications prior to visit.     ROS Review of Systems  Constitutional: Negative for appetite change, fatigue and unexpected weight change.  HENT: Negative for congestion, nosebleeds, sneezing, sore throat and trouble swallowing.   Eyes: Negative for itching and visual disturbance.  Respiratory: Negative for cough.   Cardiovascular: Negative for chest pain, palpitations and leg swelling.  Gastrointestinal: Negative for abdominal distention, blood in stool, diarrhea and nausea.  Genitourinary: Negative for frequency and hematuria.  Musculoskeletal: Positive for arthralgias. Negative for back pain, gait problem, joint swelling and neck pain.  Skin: Positive for rash.  Neurological: Negative for dizziness, tremors, speech difficulty and weakness.    Psychiatric/Behavioral: Negative for agitation, dysphoric mood and sleep disturbance. The patient is not nervous/anxious.     Objective:  BP 124/68 (BP Location: Left Arm, Patient Position: Sitting, Cuff Size: Large)   Pulse 80   Temp 98.1 F (36.7 C) (Oral)   Ht 5\' 8"  (1.727 m)   Wt 198 lb (89.8 kg)   SpO2 99%   BMI 30.11 kg/m   BP Readings from Last 3 Encounters:  04/02/17 124/68  09/25/16 130/78  03/27/16 110/70    Wt Readings from Last 3 Encounters:  04/02/17 198 lb (89.8 kg)  09/25/16 194 lb (88 kg)  03/27/16 198 lb (89.8 kg)    Physical Exam  Constitutional: He is oriented to person, place, and time. He appears well-developed. No distress.  NAD  HENT:  Mouth/Throat: Oropharynx is clear and moist.  Eyes: Pupils are equal, round, and reactive to light. Conjunctivae are normal.  Neck: Normal range of motion. No JVD present. No thyromegaly present.  Cardiovascular: Normal rate, regular rhythm, normal heart sounds and intact distal pulses.  Exam reveals no gallop and no friction rub.   No murmur heard. Pulmonary/Chest: Effort normal and breath sounds normal. No respiratory distress. He has no wheezes. He has no rales. He exhibits no tenderness.  Abdominal: Soft. Bowel sounds are normal. He exhibits no distension and no mass. There is no tenderness. There is no rebound and no guarding.  Musculoskeletal: Normal range of motion. He exhibits no edema or tenderness.  Lymphadenopathy:    He has no cervical  adenopathy.  Neurological: He is alert and oriented to person, place, and time. He has normal reflexes. No cranial nerve deficit. He exhibits normal muscle tone. He displays a negative Romberg sign. Coordination and gait normal.  Skin: Skin is warm and dry. Rash noted.  Psychiatric: He has a normal mood and affect. His behavior is normal. Judgment and thought content normal.  rosacea  Lab Results  Component Value Date   WBC 9.9 09/25/2016   HGB 14.1 09/25/2016   HCT  40.7 09/25/2016   PLT 195.0 09/25/2016   GLUCOSE 123 (H) 09/25/2016   CHOL 171 09/27/2015   TRIG 93.0 09/27/2015   HDL 56.50 09/27/2015   LDLDIRECT 145.8 07/01/2010   LDLCALC 96 09/27/2015   ALT 15 09/27/2015   AST 18 09/27/2015   NA 138 09/25/2016   K 4.0 09/25/2016   CL 104 09/25/2016   CREATININE 0.95 09/25/2016   BUN 14 09/25/2016   CO2 28 09/25/2016   TSH 1.02 09/27/2015   PSA 0.21 09/27/2015   INR 1.0 RATIO 06/26/2006    No results found.  Assessment & Plan:   There are no diagnoses linked to this encounter. I am having John Rowe maintain his aspirin, metroNIDAZOLE, cyanocobalamin, Syringe/Needle (Disp), vitamin C, pravastatin, clindamycin, diazepam, and benazepril.  No orders of the defined types were placed in this encounter.    Follow-up: No Follow-up on file.  Walker Kehr, MD

## 2017-04-02 NOTE — Assessment & Plan Note (Signed)
Lotensin

## 2017-04-14 ENCOUNTER — Other Ambulatory Visit: Payer: Self-pay | Admitting: Internal Medicine

## 2017-07-18 ENCOUNTER — Other Ambulatory Visit: Payer: Self-pay | Admitting: Internal Medicine

## 2017-10-03 ENCOUNTER — Encounter: Payer: Self-pay | Admitting: Internal Medicine

## 2017-10-03 ENCOUNTER — Other Ambulatory Visit (INDEPENDENT_AMBULATORY_CARE_PROVIDER_SITE_OTHER): Payer: Medicare Other

## 2017-10-03 ENCOUNTER — Ambulatory Visit (INDEPENDENT_AMBULATORY_CARE_PROVIDER_SITE_OTHER): Payer: Medicare Other | Admitting: Internal Medicine

## 2017-10-03 DIAGNOSIS — R739 Hyperglycemia, unspecified: Secondary | ICD-10-CM

## 2017-10-03 DIAGNOSIS — H8109 Meniere's disease, unspecified ear: Secondary | ICD-10-CM

## 2017-10-03 DIAGNOSIS — E538 Deficiency of other specified B group vitamins: Secondary | ICD-10-CM

## 2017-10-03 DIAGNOSIS — E785 Hyperlipidemia, unspecified: Secondary | ICD-10-CM | POA: Diagnosis not present

## 2017-10-03 LAB — HEMOGLOBIN A1C: HEMOGLOBIN A1C: 5.8 % (ref 4.6–6.5)

## 2017-10-03 LAB — BASIC METABOLIC PANEL
BUN: 16 mg/dL (ref 6–23)
CHLORIDE: 102 meq/L (ref 96–112)
CO2: 24 meq/L (ref 19–32)
Calcium: 9.4 mg/dL (ref 8.4–10.5)
Creatinine, Ser: 1.23 mg/dL (ref 0.40–1.50)
GFR: 61.13 mL/min (ref >60.00)
Glucose, Bld: 105 mg/dL — ABNORMAL HIGH (ref 70–99)
POTASSIUM: 4.6 meq/L (ref 3.5–5.1)
Sodium: 139 mEq/L (ref 135–145)

## 2017-10-03 MED ORDER — DIAZEPAM 5 MG PO TABS: 5.0000 mg | ORAL_TABLET | Freq: Every day | ORAL | 3 refills | Status: DC | PRN

## 2017-10-03 MED ORDER — VITAMIN D3 50 MCG (2000 UT) PO CAPS
2000.0000 [IU] | ORAL_CAPSULE | Freq: Every day | ORAL | 3 refills | Status: DC
Start: 2017-10-03 — End: 2017-10-11

## 2017-10-03 MED ORDER — CYANOCOBALAMIN 1000 MCG SL SUBL: 1.0000 | SUBLINGUAL_TABLET | Freq: Every day | SUBLINGUAL | 11 refills | Status: DC

## 2017-10-03 NOTE — Patient Instructions (Signed)
[employer]: Teacher, adult education

## 2017-10-03 NOTE — Assessment & Plan Note (Signed)
Pravastatin  

## 2017-10-03 NOTE — Assessment & Plan Note (Signed)
Re-start B12 

## 2017-10-03 NOTE — Assessment & Plan Note (Signed)
Labs

## 2017-10-03 NOTE — Assessment & Plan Note (Signed)
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

## 2017-10-03 NOTE — Progress Notes (Signed)
Subjective:  Patient ID: John Rowe, male    DOB: May 13, 1946  Age: 72 y.o. MRN: [id number]  CC: No chief complaint on file.   HPI John Rowe presents for HTN, Menier's disease, dyslipidemia f/u C/o hearing loss  Outpatient Medications Prior to Visit  Medication Sig Dispense Refill  . Ascorbic Acid (VITAMIN C) 100 MG tablet Take 1 tablet (100 mg total) by mouth daily. For bruising 100 tablet 3  . aspirin (BAYER ASPIRIN) 325 MG tablet Take 1 tablet (325 mg total) by mouth daily. 100 tablet 3  . benazepril (LOTENSIN) 20 MG tablet TAKE ONE-HALF TABLET BY MOUTH DAILY 135 tablet 1  . clindamycin (CLEOCIN T) 1 % lotion Apply topically 2 (two) times daily. On face 60 mL 5  . cyanocobalamin (COBAL-1000) 1000 MCG/ML injection Use weekly x 1 month then monthly sq 10 mL 11  . diazepam (VALIUM) 5 MG tablet Take 1 tablet (5 mg total) by mouth daily as needed. 30 tablet 3  . metroNIDAZOLE (METROGEL) 1 % gel Apply topically daily. 45 g 0  . pravastatin (PRAVACHOL) 20 MG tablet TAKE 1 TABLET BY MOUTH DAILY 30 tablet 11  . Syringe/Needle, Disp, (B-D ECLIPSE SYRINGE) 30G X 1/2" 1 ML MISC Use sq for B12 injections 50 each 11   No facility-administered medications prior to visit.     ROS Review of Systems  Constitutional: Negative for appetite change, fatigue and unexpected weight change.  HENT: Positive for hearing loss. Negative for congestion, nosebleeds, sneezing, sore throat and trouble swallowing.   Eyes: Negative for itching and visual disturbance.  Respiratory: Negative for cough.   Cardiovascular: Negative for chest pain, palpitations and leg swelling.  Gastrointestinal: Negative for abdominal distention, blood in stool, diarrhea and nausea.  Genitourinary: Negative for frequency and hematuria.  Musculoskeletal: Negative for back pain, gait problem, joint swelling and neck pain.  Skin: Negative for rash.  Neurological: Negative for dizziness, tremors, speech difficulty and weakness.   Psychiatric/Behavioral: Negative for agitation, dysphoric mood and sleep disturbance. The patient is not nervous/anxious.     Objective:  BP 118/70 (BP Location: Left Arm, Patient Position: Sitting, Cuff Size: Large)   Pulse 70   Temp 97.7 F (36.5 C) (Oral)   Ht 5\' 8"  (1.727 m)   Wt 197 lb (89.4 kg)   SpO2 99%   BMI 29.95 kg/m   BP Readings from Last 3 Encounters:  10/03/17 118/70  04/02/17 124/68  09/25/16 130/78    Wt Readings from Last 3 Encounters:  10/03/17 197 lb (89.4 kg)  04/02/17 198 lb (89.8 kg)  09/25/16 194 lb (88 kg)    Physical Exam  Constitutional: He is oriented to person, place, and time. He appears well-developed. No distress.  NAD  HENT:  Mouth/Throat: Oropharynx is clear and moist.  Eyes: Conjunctivae are normal. Pupils are equal, round, and reactive to light.  Neck: Normal range of motion. No JVD present. No thyromegaly present.  Cardiovascular: Normal rate, regular rhythm, normal heart sounds and intact distal pulses. Exam reveals no gallop and no friction rub.  No murmur heard. Pulmonary/Chest: Effort normal and breath sounds normal. No respiratory distress. He has no wheezes. He has no rales. He exhibits no tenderness.  Abdominal: Soft. Bowel sounds are normal. He exhibits no distension and no mass. There is no tenderness. There is no rebound and no guarding.  Musculoskeletal: Normal range of motion. He exhibits no edema or tenderness.  Lymphadenopathy:    He has no cervical adenopathy.  Neurological: He is alert and oriented to person, place, and time. He has normal reflexes. No cranial nerve deficit. He exhibits normal muscle tone. He displays a negative Romberg sign. Coordination and gait normal.  Skin: Skin is warm and dry. No rash noted.  Psychiatric: He has a normal mood and affect. His behavior is normal. Judgment and thought content normal.  decreased hearing  Lab Results  Component Value Date   WBC 9.9 09/25/2016   HGB 14.1  09/25/2016   HCT 40.7 09/25/2016   PLT 195.0 09/25/2016   GLUCOSE 149 (H) 04/02/2017   CHOL 171 09/27/2015   TRIG 93.0 09/27/2015   HDL 56.50 09/27/2015   LDLDIRECT 145.8 07/01/2010   LDLCALC 96 09/27/2015   ALT 15 09/27/2015   AST 18 09/27/2015   NA 136 04/02/2017   K 4.1 04/02/2017   CL 102 04/02/2017   CREATININE 1.06 04/02/2017   BUN 17 04/02/2017   CO2 25 04/02/2017   TSH 1.02 09/27/2015   PSA 0.21 09/27/2015   INR 1.0 RATIO 06/26/2006   HGBA1C 5.5 04/02/2017    No results found.  Assessment & Plan:   There are no diagnoses linked to this encounter. I am having Sandy Salaam. Esterly maintain his aspirin, metroNIDAZOLE, cyanocobalamin, Syringe/Needle (Disp), vitamin C, clindamycin, diazepam, pravastatin, and benazepril.  No orders of the defined types were placed in this encounter.    Follow-up: No Follow-up on file.  Walker Kehr, MD

## 2017-10-11 ENCOUNTER — Ambulatory Visit (INDEPENDENT_AMBULATORY_CARE_PROVIDER_SITE_OTHER): Payer: Medicare Other | Admitting: Family Medicine

## 2017-10-11 ENCOUNTER — Ambulatory Visit (HOSPITAL_COMMUNITY)
Admission: EM | Admit: 2017-10-11 | Discharge: 2017-10-11 | Disposition: A | Discharge status: Home / Self Care | Payer: Medicare Other | Attending: Emergency Medicine | Admitting: Emergency Medicine

## 2017-10-11 ENCOUNTER — Encounter (HOSPITAL_COMMUNITY): Payer: Self-pay | Admitting: Emergency Medicine

## 2017-10-11 ENCOUNTER — Encounter (HOSPITAL_COMMUNITY)
Admission: EM | Disposition: A | Discharge status: Home / Self Care | Payer: Self-pay | Source: Home / Self Care | Attending: Emergency Medicine

## 2017-10-11 ENCOUNTER — Other Ambulatory Visit: Payer: Self-pay

## 2017-10-11 ENCOUNTER — Emergency Department (HOSPITAL_COMMUNITY): Payer: Medicare Other | Admitting: Anesthesiology

## 2017-10-11 ENCOUNTER — Ambulatory Visit: Payer: Self-pay | Admitting: *Deleted

## 2017-10-11 ENCOUNTER — Encounter: Payer: Self-pay | Admitting: Family Medicine

## 2017-10-11 VITALS — BP 132/76 | HR 75 | Temp 97.7°F | Ht 68.0 in | Wt 198.0 lb

## 2017-10-11 DIAGNOSIS — Z881 Allergy status to other antibiotic agents status: Secondary | ICD-10-CM | POA: Diagnosis not present

## 2017-10-11 DIAGNOSIS — Z87891 Personal history of nicotine dependence: Secondary | ICD-10-CM | POA: Diagnosis not present

## 2017-10-11 DIAGNOSIS — E785 Hyperlipidemia, unspecified: Secondary | ICD-10-CM | POA: Diagnosis not present

## 2017-10-11 DIAGNOSIS — T189XXA Foreign body of alimentary tract, part unspecified, initial encounter: Secondary | ICD-10-CM | POA: Diagnosis present

## 2017-10-11 DIAGNOSIS — I10 Essential (primary) hypertension: Secondary | ICD-10-CM | POA: Diagnosis not present

## 2017-10-11 DIAGNOSIS — E538 Deficiency of other specified B group vitamins: Secondary | ICD-10-CM | POA: Insufficient documentation

## 2017-10-11 DIAGNOSIS — H9192 Unspecified hearing loss, left ear: Secondary | ICD-10-CM | POA: Diagnosis not present

## 2017-10-11 DIAGNOSIS — Z7982 Long term (current) use of aspirin: Secondary | ICD-10-CM | POA: Diagnosis not present

## 2017-10-11 DIAGNOSIS — Z885 Allergy status to narcotic agent status: Secondary | ICD-10-CM | POA: Insufficient documentation

## 2017-10-11 DIAGNOSIS — K222 Esophageal obstruction: Secondary | ICD-10-CM | POA: Diagnosis not present

## 2017-10-11 DIAGNOSIS — R1310 Dysphagia, unspecified: Secondary | ICD-10-CM | POA: Diagnosis not present

## 2017-10-11 DIAGNOSIS — K2210 Ulcer of esophagus without bleeding: Secondary | ICD-10-CM | POA: Insufficient documentation

## 2017-10-11 DIAGNOSIS — X58XXXA Exposure to other specified factors, initial encounter: Secondary | ICD-10-CM | POA: Diagnosis not present

## 2017-10-11 DIAGNOSIS — T18128A Food in esophagus causing other injury, initial encounter: Secondary | ICD-10-CM

## 2017-10-11 DIAGNOSIS — Z79899 Other long term (current) drug therapy: Secondary | ICD-10-CM | POA: Diagnosis not present

## 2017-10-11 DIAGNOSIS — R1319 Other dysphagia: Secondary | ICD-10-CM

## 2017-10-11 HISTORY — PX: ESOPHAGOGASTRODUODENOSCOPY (EGD) WITH PROPOFOL: SHX5813

## 2017-10-11 SURGERY — ESOPHAGOGASTRODUODENOSCOPY (EGD) WITH PROPOFOL
Anesthesia: General

## 2017-10-11 MED ORDER — ONDANSETRON HCL 4 MG/2ML IJ SOLN
INTRAMUSCULAR | Status: AC
  Filled 2017-10-11: qty 2

## 2017-10-11 MED ORDER — FENTANYL CITRATE (PF) 100 MCG/2ML IJ SOLN
INTRAMUSCULAR | Status: DC | PRN
  Administered 2017-10-11 (×2): 25 ug via INTRAVENOUS

## 2017-10-11 MED ORDER — LIDOCAINE HCL (CARDIAC) 20 MG/ML IV SOLN
INTRAVENOUS | Status: DC | PRN
  Administered 2017-10-11: 25 mg via INTRATRACHEAL
  Administered 2017-10-11: 75 mg via INTRAVENOUS

## 2017-10-11 MED ORDER — PROPOFOL 10 MG/ML IV BOLUS
INTRAVENOUS | Status: DC | PRN
  Administered 2017-10-11: 30 mg via INTRAVENOUS
  Administered 2017-10-11: 40 mg via INTRAVENOUS
  Administered 2017-10-11: 140 mg via INTRAVENOUS

## 2017-10-11 MED ORDER — DEXAMETHASONE SODIUM PHOSPHATE 10 MG/ML IJ SOLN
INTRAMUSCULAR | Status: DC | PRN
Start: 2017-10-11 — End: 2017-10-11
  Administered 2017-10-11: 10 mg via INTRAVENOUS

## 2017-10-11 MED ORDER — LACTATED RINGERS IV SOLN
INTRAVENOUS | Status: DC
  Administered 2017-10-11: 14:00:00 via INTRAVENOUS

## 2017-10-11 MED ORDER — GLUCAGON HCL RDNA (DIAGNOSTIC) 1 MG IJ SOLR
1.0000 mg | Freq: Once | INTRAMUSCULAR | Status: AC
  Administered 2017-10-11: 1 mg via INTRAVENOUS
  Filled 2017-10-11: qty 1

## 2017-10-11 MED ORDER — LABETALOL HCL 5 MG/ML IV SOLN
INTRAVENOUS | Status: DC | PRN
  Administered 2017-10-11: 2.5 mg via INTRAVENOUS

## 2017-10-11 MED ORDER — FENTANYL CITRATE (PF) 100 MCG/2ML IJ SOLN
INTRAMUSCULAR | Status: AC
  Filled 2017-10-11: qty 2

## 2017-10-11 MED ORDER — FENTANYL CITRATE (PF) 250 MCG/5ML IJ SOLN
INTRAMUSCULAR | Status: DC | PRN
  Administered 2017-10-11: 25 ug via INTRAVENOUS

## 2017-10-11 MED ORDER — GLYCOPYRROLATE 0.2 MG/ML IJ SOLN
INTRAMUSCULAR | Status: DC | PRN
  Administered 2017-10-11: 0.2 mg via INTRAVENOUS

## 2017-10-11 MED ORDER — OMEPRAZOLE 40 MG PO CPDR: 40.0000 mg | DELAYED_RELEASE_CAPSULE | Freq: Two times a day (BID) | ORAL | 3 refills | Status: DC

## 2017-10-11 MED ORDER — PROPOFOL 10 MG/ML IV BOLUS
INTRAVENOUS | Status: AC
Start: 2017-10-11 — End: 2017-10-11
  Filled 2017-10-11: qty 40

## 2017-10-11 SURGICAL SUPPLY — 14 items

## 2017-10-11 NOTE — ED Notes (Signed)
Gastroenterology at bedside.

## 2017-10-11 NOTE — Anesthesia Procedure Notes (Signed)
Procedure Name: Intubation Date/Time: 10/11/2017 2:37 PM Performed by: Lissa Morales, CRNA Pre-anesthesia Checklist: Patient identified, Emergency Drugs available, Suction available and Patient being monitored Patient Re-evaluated:Patient Re-evaluated prior to induction Oxygen Delivery Method: Circle system utilized Preoxygenation: Pre-oxygenation with 100% oxygen Induction Type: IV induction Ventilation: Mask ventilation without difficulty Laryngoscope Size: Mac and 4 Grade View: Grade II Tube type: Oral Tube size: 7.5 mm Number of attempts: 1 Airway Equipment and Method: Stylet and Oral airway Placement Confirmation: ETT inserted through vocal cords under direct vision,  positive ETCO2 and breath sounds checked- equal and bilateral Secured at: 22 cm Tube secured with: Tape Dental Injury: Teeth and Oropharynx as per pre-operative assessment

## 2017-10-11 NOTE — Consult Note (Signed)
[facility] Consult: 1:40 PM 10/11/2017  LOS: 0 days    Referring Provider: Dr Winfred Leeds  Primary Care Physician:  Cassandria Anger, MD Primary Gastroenterologist:  Dr. Olevia Perches     Reason for Consultation:  Acute liquid dysphagia   HPI: John Rowe is a 72 y.o. male.  Hx Htn.  Meniere's dz. spinal stenosis and degenerative disc disease.  Rosacea 2004 Colonoscopy.  Unable to locate procedure report.    Pt with unremarkable past hx in terms of GI sxs.  Rare occasional choking/coughing with PO.   Last night he snacked and drank beer at [employer].  No dysphagia.  Overnight non-productive cough developed.  In AM, unable to drink coffee, would not pass.   Subsequent attempts to drink water with same results.  + regurgitation.  Then unable to swallow secretions.   Dr  Raeford Razor in [place] office sent him to ED.  Got Glucagon but no relief so far.      Past Medical History:  Diagnosis Date  . Chest pain   . Hearing loss of left ear   . Hyperlipidemia   . Hypertension   . Meniere disease     Past Surgical History:  Procedure Laterality Date  . COLONOSCOPY      Prior to Admission medications   Medication Sig Start Date End Date Taking? Authorizing Provider  aspirin (BAYER ASPIRIN) 325 MG tablet Take 1 tablet (325 mg total) by mouth daily. 09/27/15  Yes Plotnikov, Evie Lacks, MD  benazepril (LOTENSIN) 20 MG tablet TAKE ONE-HALF TABLET BY MOUTH DAILY 07/18/17  Yes Plotnikov, Evie Lacks, MD  pravastatin (PRAVACHOL) 20 MG tablet TAKE 1 TABLET BY MOUTH DAILY 04/16/17  Yes Plotnikov, Evie Lacks, MD  Ascorbic Acid (VITAMIN C) 100 MG tablet Take 1 tablet (100 mg total) by mouth daily. For bruising Patient not taking: Reported on 10/11/2017 03/27/16   Plotnikov, Evie Lacks, MD  Cholecalciferol (VITAMIN D3) 2000  units capsule Take 1 capsule (2,000 Units total) by mouth daily. Patient not taking: Reported on 10/11/2017 10/03/17   Plotnikov, Evie Lacks, MD  clindamycin (CLEOCIN T) 1 % lotion Apply topically 2 (two) times daily. On face Patient not taking: Reported on 10/11/2017 09/25/16   Plotnikov, Evie Lacks, MD  Cyanocobalamin 1000 MCG SUBL Place 1 tablet (1,000 mcg total) under the tongue daily. Patient not taking: Reported on 10/11/2017 10/03/17   Plotnikov, Evie Lacks, MD  diazepam (VALIUM) 5 MG tablet Take 1 tablet (5 mg total) by mouth daily as needed. Patient not taking: Reported on 10/11/2017 10/03/17   Plotnikov, Evie Lacks, MD  metroNIDAZOLE (METROGEL) 1 % gel Apply topically daily. Patient not taking: Reported on 10/11/2017 09/27/15   Plotnikov, Evie Lacks, MD  Syringe/Needle, Disp, (B-D ECLIPSE SYRINGE) 30G X 1/2" 1 ML MISC Use sq for B12 injections 10/07/15   Plotnikov, Evie Lacks, MD    Scheduled Meds: . glucagon (human recombinant)  1 mg Intravenous Once   Infusions:  PRN Meds:    Allergies as of 10/11/2017 - Review Complete 10/11/2017  Allergen Reaction Noted  .  Oxycodone-acetaminophen  10/07/2009  . Tetracycline hcl      Family History  Adopted: Yes  Problem Relation Age of Onset  . Other Father        Deceased, sepsis  . Healthy Child   . Other Grandchild        1p36 syndrome    Social History   Socioeconomic History  . Marital status: Married    Spouse name: Not on file  . Number of children: Not on file  . Years of education: Not on file  . Highest education level: Not on file  Social Needs  . Financial resource strain: Not on file  . Food insecurity - worry: Not on file  . Food insecurity - inability: Not on file  . Transportation needs - medical: Not on file  . Transportation needs - non-medical: Not on file  Occupational History  . Occupation: retired  Tobacco Use  . Smoking status: Former Smoker    Last attempt to quit: 09/12/1979    Years since quitting: 38.1    . Smokeless tobacco: Never Used  Substance and Sexual Activity  . Alcohol use: Yes    Alcohol/week: 0.0 oz    Comment: Drinks 3-4 beers nightly x 50 years  . Drug use: No  . Sexual activity: Yes  Other Topics Concern  . Not on file  Social History Narrative   Lives with wife in a 4 story home.  Has 3 children and 7 grandkids.  Retired from [employer].     Education: some college.      REVIEW OF SYSTEMS: Constitutional: No weakness, no fatigue ENT:  No nose bleeds Pulm: No trouble breathing.  Cough as per HPI CV:  No palpitations, no LE edema.  GU:  No hematuria, no frequency GI: Regular, formed stools.  No upper GI symptoms of note. Heme: No unusual bleeding or bruising.  No history of anemia. Transfusions: None Neuro:  No headaches, no peripheral tingling or numbness Derm:  No itching, no rash or sores.  Endocrine:  No sweats or chills.  No polyuria or dysuria Immunization: Reviewed.  His flu shot and pneumococcal, tetanus diphtheria vaccinations are up-to-date Travel:  None beyond local counties in last few months.    PHYSICAL EXAM: Vital signs in last 24 hours: Vitals:   10/11/17 1202  BP: (!) 149/103  Pulse: 85  Resp: 15  Temp: 97.6 F (36.4 C)  SpO2: 98%   Wt Readings from Last 3 Encounters:  10/11/17 89.8 kg (198 lb)  10/03/17 89.4 kg (197 lb)  04/02/17 89.8 kg (198 lb)   General: Pleasant, alert, comfortable older WM.  Does not look ill. Head: No facial asymmetry or swelling.  Facial rubor consistent with rosacea. Eyes: No scleral icterus, no conjunctival pallor.  EOMI. Ears: No obvious hearing deficit. Nose: No congestion or discharge. Mouth: Moist, clear oral mucosa.  Tongue midline. Neck: No JVD, no masses, no thyromegaly.  No tenderness. Lungs: Clear bilaterally.  No labored breathing.  No coughing. Heart: RRR.  No MRG.  S1, S2 present Abdomen: Soft.  Not tender or distended.  No masses.  No HSM.  No hernias.  Active bowel sounds..   Rectal:  Deferred Musc/Skeltl: No obvious joint deformities. Extremities: No CCE. Neurologic: Alert.  Oriented x3.  Good historian.  No obvious deficits. Skin: No sores.  Rosacea on the face. Tattoos: None seen Nodes: No cervical adenopathy. Psych: Calm, pleasant, cooperative.  Intake/Output from previous day: No intake/output data recorded. Intake/Output this shift: No  intake/output data recorded.  LAB RESULTS: No results for input(s): WBC, HGB, HCT, PLT in the last 72 hours. BMET Lab Results  Component Value Date   NA 139 10/03/2017   NA 136 04/02/2017   NA 138 09/25/2016   K 4.6 10/03/2017   K 4.1 04/02/2017   K 4.0 09/25/2016   CL 102 10/03/2017   CL 102 04/02/2017   CL 104 09/25/2016   CO2 24 10/03/2017   CO2 25 04/02/2017   CO2 28 09/25/2016   GLUCOSE 105 (H) 10/03/2017   GLUCOSE 149 (H) 04/02/2017   GLUCOSE 123 (H) 09/25/2016   BUN 16 10/03/2017   BUN 17 04/02/2017   BUN 14 09/25/2016   CREATININE 1.23 10/03/2017   CREATININE 1.06 04/02/2017   CREATININE 0.95 09/25/2016   CALCIUM 9.4 10/03/2017   CALCIUM 9.1 04/02/2017   CALCIUM 9.0 09/25/2016   LFT No results for input(s): PROT, ALBUMIN, AST, ALT, ALKPHOS, BILITOT, BILIDIR, IBILI in the last 72 hours. PT/INR Lab Results  Component Value Date   INR 1.0 RATIO 06/26/2006     IMPRESSION:   *   Acute dysphagia.  Question delayed symptoms of food impaction.    PLAN:     *   Proceed with upper endoscopy by Dr. Ardis Hughs today.   Azucena Freed  10/11/2017, 1:40 PM Pager: [phone]    ________________________________________________________________________  Velora Heckler GI MD note:  I personally examined the patient, reviewed the data and agree with the assessment and plan described above.  Will proceed with EGD now.   Owens Loffler, MD [facility] Pager [phone]

## 2017-10-11 NOTE — ED Notes (Signed)
ED Provider at bedside. 

## 2017-10-11 NOTE — Anesthesia Postprocedure Evaluation (Signed)
Anesthesia Post Note  Patient: John Rowe  Procedure(s) Performed: ESOPHAGOGASTRODUODENOSCOPY (EGD) WITH PROPOFOL (N/A )     Patient location during evaluation: PACU Anesthesia Type: General Level of consciousness: awake and alert Pain management: pain level controlled Vital Signs Assessment: post-procedure vital signs reviewed and stable Respiratory status: spontaneous breathing, nonlabored ventilation, respiratory function stable and patient connected to nasal cannula oxygen Cardiovascular status: blood pressure returned to baseline and stable Postop Assessment: no apparent nausea or vomiting Anesthetic complications: no    Last Vitals:  Vitals:   10/11/17 1518 10/11/17 1520  BP:    Pulse:  80  Resp:  11  Temp: 37.1 C   SpO2:  96%    Last Pain:  Vitals:   10/11/17 1518  TempSrc: Oral  PainSc:                  Barnet Glasgow

## 2017-10-11 NOTE — Anesthesia Preprocedure Evaluation (Addendum)
Anesthesia Evaluation  Patient identified by MRN, date of birth, ID band Patient awake    Reviewed: Allergy & Precautions, NPO status , Patient's Chart, lab work & pertinent test results  History of Anesthesia Complications (+) PSEUDOCHOLINESTERASE DEFICIENCY  Airway Mallampati: II  TM Distance: >3 FB Neck ROM: Full    Dental no notable dental hx. (+) Upper Dentures, Partial Lower   Pulmonary neg pulmonary ROS, former smoker,    Pulmonary exam normal breath sounds clear to auscultation       Cardiovascular Exercise Tolerance: Good hypertension, Normal cardiovascular exam Rhythm:Regular Rate:Normal     Neuro/Psych negative neurological ROS  negative psych ROS   GI/Hepatic negative GI ROS, Neg liver ROS,   Endo/Other  negative endocrine ROS  Renal/GU negative Renal ROS  negative genitourinary   Musculoskeletal negative musculoskeletal ROS (+)   Abdominal   Peds negative pediatric ROS (+)  Hematology negative hematology ROS (+)   Anesthesia Other Findings   Reproductive/Obstetrics                           Lab Results  Component Value Date   CREATININE 1.23 10/03/2017   BUN 16 10/03/2017   NA 139 10/03/2017   K 4.6 10/03/2017   CL 102 10/03/2017   CO2 24 10/03/2017    Lab Results  Component Value Date   WBC 9.9 09/25/2016   HGB 14.1 09/25/2016   HCT 40.7 09/25/2016   MCV 95.4 09/25/2016   PLT 195.0 09/25/2016    Anesthesia Physical Anesthesia Plan  ASA: III  Anesthesia Plan: General   Post-op Pain Management:    Induction: Intravenous  PONV Risk Score and Plan: Treatment may vary due to age or medical condition  Airway Management Planned: Oral ETT  Additional Equipment:   Intra-op Plan:   Post-operative Plan: Extubation in OR  Informed Consent: I have reviewed the patients History and Physical, chart, labs and discussed the procedure including the risks,  benefits and alternatives for the proposed anesthesia with the patient or authorized representative who has indicated his/her understanding and acceptance.   Dental advisory given  Plan Discussed with: CRNA and Anesthesiologist  Anesthesia Plan Comments:         Anesthesia Quick Evaluation

## 2017-10-11 NOTE — Patient Instructions (Signed)
Please go to the emergency department.  I will call over there and tell them that you're coming.  You failed a PO challenge

## 2017-10-11 NOTE — ED Notes (Signed)
Patient transferred to endo.

## 2017-10-11 NOTE — Assessment & Plan Note (Signed)
Unclear as to the source of his dysphagia. No prior history. He was unable to tolerate a PO challenge of water in the clinic.  - advised to go to ER for evaluation. May need a scope by GI.

## 2017-10-11 NOTE — Transfer of Care (Signed)
Immediate Anesthesia Transfer of Care Note  Patient: John Rowe  Procedure(s) Performed: ESOPHAGOGASTRODUODENOSCOPY (EGD) WITH PROPOFOL (N/A )  Patient Location: PACU  Anesthesia Type:General  Level of Consciousness: awake, alert , oriented and patient cooperative  Airway & Oxygen Therapy: Patient Spontanous Breathing and Patient connected to face mask oxygen  Post-op Assessment: Report given to RN, Post -op Vital signs reviewed and stable and Patient moving all extremities X 4  Post vital signs: stable  Last Vitals:  Vitals:   10/11/17 1400 10/11/17 1513  BP: 126/82 124/79  Pulse: 88 81  Resp: 18 13  Temp: 36.6 C   SpO2: 99% 100%    Last Pain:  Vitals:   10/11/17 1400  TempSrc: Oral  PainSc:          Complications: No apparent anesthesia complications

## 2017-10-11 NOTE — Telephone Encounter (Signed)
Called in c/o difficulty swallowing liquids this morning.  He has not tried solids.  The liquid is getting stuck just after he swallows it and he has to spit it back out.   He ate some spicy snacks of popcorn, pretzels, small pieces last night.   He denies getting choked.    He mentioned he coughed a lot when he went to bed. No history of having his esophagus dilated or stomach troubles.  I made an appt with Dr. Clearance Coots for today at 11:30 since Dr. Alain Marion was booked for the day.  Reason for Disposition . [1] Symptoms of pill stuck in throat or esophagus (e.g., pain in throat or chest, FB sensation) AND [2] no relief after using CARE ADVICE  Answer Assessment - Initial Assessment Questions 1. SYMPTOM: "Are you having difficulty swallowing liquids, solids, or both?"     I'm having trouble swallowing liquids.  I've not tried solids.  It feels like the liquids are getting stuck and mucus comes back up.   It's a clear like saliva. 2. ONSET: "When did the swallowing problems begin?"      It just started this morning.   I coughed a lot last night when I went to bed. 3. CAUSE: "What do you think is causing the problem?"      I didn't get choked.  I ate spice mix of pretzels, popcorn and small chips last night. 4. CHRONIC/RECURRENT: "Is this a new problem for you?"  If no, ask: "How long have you had this problem?" (e.G., days, weeks, months)      No 5. OTHER SYMPTOMS: "Do you have any other symptoms?" (e.g., difficulty breathing, sore throat, swollen tongue, chest pain)     I feel like I need to burp when I swallow.  No sore throat or tongue swelling. 6. PREGNANCY: "Is there any chance you are pregnant?" "When was your last menstrual period?"     N/A  Protocols used: SWALLOWING DIFFICULTY-A-AH

## 2017-10-11 NOTE — ED Provider Notes (Signed)
[facility] DEPT Provider Note   CSN: [id number] Arrival date & time: 10/11/17  1155     History   Chief Complaint Chief Complaint  Patient presents with  . Dysphagia    HPI John Rowe is a 72 y.o. male.   He reports inability to swallow saliva, liquids or solids since eating snack mix last night.  He has foreign body sensation at midsternal area.  Seen by Dr. Raeford Razor prior to coming here and sent here for further evaluation.  He denies difficulty breathing denies fever denies other complaint.  Nothing makes symptoms better or worse. HPI  Past Medical History:  Diagnosis Date  . Chest pain   . Hearing loss of left ear   . Hyperlipidemia   . Hypertension   . Meniere disease     Patient Active Problem List   Diagnosis Date Noted  . Dysphagia 10/11/2017  . Hyperglycemia 10/03/2017  . Rash and nonspecific skin eruption 03/27/2016  . Vitamin B12 deficiency 10/07/2015  . Paresthesia 09/27/2015  . Plantar fasciitis, right 09/27/2015  . Actinic keratosis 09/24/2014  . Cerumen impaction 09/24/2014  . Well adult exam 09/24/2014  . LUMBAR RADICULOPATHY, LEFT 09/08/2009  . CALCANEAL SPUR 03/26/2009  . COUGH, CHRONIC 11/04/2008  . ROTATOR CUFF INJURY, LEFT SHOULDER 09/29/2008  . 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 Dyslipidemia 03/25/2008  . Meniere's disease 03/25/2008  . DECREASED HEARING, LEFT EAR 03/25/2008  . 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 Essential hypertension 04/22/2007  . Rosacea 04/22/2007    Past Surgical History:  Procedure Laterality Date  . COLONOSCOPY         Home Medications    Prior to Admission medications   Medication Sig Start Date End Date Taking? Authorizing Provider  aspirin (BAYER ASPIRIN) 325 MG tablet Take 1 tablet (325 mg total) by mouth daily. 09/27/15  Yes Plotnikov, Evie Lacks, MD  benazepril (LOTENSIN) 20 MG tablet  TAKE ONE-HALF TABLET BY MOUTH DAILY 07/18/17  Yes Plotnikov, Evie Lacks, MD  pravastatin (PRAVACHOL) 20 MG tablet TAKE 1 TABLET BY MOUTH DAILY 04/16/17  Yes Plotnikov, Evie Lacks, MD  Ascorbic Acid (VITAMIN C) 100 MG tablet Take 1 tablet (100 mg total) by mouth daily. For bruising Patient not taking: Reported on 10/11/2017 03/27/16   Plotnikov, Evie Lacks, MD  Cholecalciferol (VITAMIN D3) 2000 units capsule Take 1 capsule (2,000 Units total) by mouth daily. Patient not taking: Reported on 10/11/2017 10/03/17   Plotnikov, Evie Lacks, MD  clindamycin (CLEOCIN T) 1 % lotion Apply topically 2 (two) times daily. On face Patient not taking: Reported on 10/11/2017 09/25/16   Plotnikov, Evie Lacks, MD  Cyanocobalamin 1000 MCG SUBL Place 1 tablet (1,000 mcg total) under the tongue daily. Patient not taking: Reported on 10/11/2017 10/03/17   Plotnikov, Evie Lacks, MD  diazepam (VALIUM) 5 MG tablet Take 1 tablet (5 mg total) by mouth daily as needed. Patient not taking: Reported on 10/11/2017 10/03/17   Plotnikov, Evie Lacks, MD  metroNIDAZOLE (METROGEL) 1 % gel Apply topically daily. Patient not taking: Reported on 10/11/2017 09/27/15   Plotnikov, Evie Lacks, MD  Syringe/Needle, Disp, (B-D ECLIPSE SYRINGE) 30G X 1/2" 1 ML MISC Use sq for B12 injections 10/07/15   Plotnikov, Evie Lacks, MD    Family History Family History  Adopted: Yes  Problem Relation Age of Onset  . Other Father        Deceased, sepsis  .  Healthy Child   . Other Grandchild        1p36 syndrome    Social History Social History   Tobacco Use  . Smoking status: Former Smoker    Last attempt to quit: 09/12/1979    Years since quitting: 38.1  . Smokeless tobacco: Never Used  Substance Use Topics  . Alcohol use: Yes    Alcohol/week: 0.0 oz    Comment: Drinks 3-4 beers nightly x 50 years  . Drug use: No     Allergies   Oxycodone-acetaminophen and Tetracycline hcl   Review of Systems Review of Systems  Constitutional: Negative.   HENT:  Positive for trouble swallowing.   Respiratory: Negative.   Cardiovascular: Negative.   Gastrointestinal: Negative.   Musculoskeletal: Negative.   Skin: Negative.   Neurological: Negative.   Psychiatric/Behavioral: Negative.   All other systems reviewed and are negative.    Physical Exam Updated Vital Signs BP (!) 149/103   Pulse 85   Temp 97.6 F (36.4 C) (Oral)   Resp 15   SpO2 98%   Physical Exam  Constitutional: He appears well-developed and well-nourished.  HENT:  Head: Normocephalic and atraumatic.  Eyes: Conjunctivae are normal. Pupils are equal, round, and reactive to light.  Neck: Neck supple. No tracheal deviation present. No thyromegaly present.  Cardiovascular: Normal rate and regular rhythm.  No murmur heard. Pulmonary/Chest: Effort normal and breath sounds normal.  Abdominal: Soft. Bowel sounds are normal. He exhibits no distension. There is no tenderness.  Musculoskeletal: Normal range of motion. He exhibits no edema or tenderness.  Neurological: He is alert. Coordination normal.  Skin: Skin is warm and dry. No rash noted.  Psychiatric: He has a normal mood and affect.  Nursing note and vitals reviewed.    ED Treatments / Results  Labs (all labs ordered are listed, but only abnormal results are displayed) Labs Reviewed - No data to display  EKG  EKG Interpretation None       Radiology No results found.  Procedures Procedures (including critical care time)  Medications Ordered in ED Medications - No data to display   Initial Impression / Assessment and Plan / ED Course  I have reviewed the triage vital signs and the nursing notes.  Pertinent labs & imaging results that were available during my care of the patient were reviewed by me and considered in my medical decision making (see chart for details).     Patient given water to drink.  He regurgitates immediately. IV glucagon was administered.  Amy Esterwood from [facility]  gastroenterology service came to evaluate patient and patient was taken directly to the endoscopy suite Final Clinical Impressions(s) / ED Diagnoses  Diagnosis esophageal dysphagia Final diagnoses:  None    ED Discharge Orders    None       Orlie Dakin, MD 10/11/17 1433

## 2017-10-11 NOTE — ED Triage Notes (Signed)
Pt verbalizes coughing when he went to bed last night. Sent from PCP related to "can't swallow anything; I choke." Pt denies any neuro symptoms.

## 2017-10-11 NOTE — Progress Notes (Signed)
John Rowe - 72 y.o. male MRN [id number]  Date of birth: [birth year]  SUBJECTIVE:  Including CC & ROS.  Chief Complaint  Patient presents with  . Dysphagia    John Rowe is a 72 y.o. male that is presenting with difficulty swallow. Symptoms started this morning. He ate some popcorn and snack mix last night, afterwards coughed a lot. Feels like something is stuck in his throat. Denies reflux, nausea or vomiting. He has not been able to eat or drink today. Every time he drinks he has an episode of emesis. No pain. Has not had any similar episodes.    Review of Systems  Constitutional: Negative for fever.  HENT: Positive for trouble swallowing. Negative for ear pain, sore throat and voice change.   Respiratory: Negative for cough.   Cardiovascular: Negative for chest pain.  Gastrointestinal: Negative for abdominal pain.  Musculoskeletal: Negative for back pain.  Neurological: Negative for weakness.  Hematological: Negative for adenopathy.  Psychiatric/Behavioral: Negative for behavioral problems.    HISTORY: Past Medical, Surgical, Social, and Family History Reviewed & Updated per EMR.   Pertinent Historical Findings include:  Past Medical History:  Diagnosis Date  . Chest pain   . Hearing loss of left ear   . Hyperlipidemia   . Hypertension   . Meniere disease     Past Surgical History:  Procedure Laterality Date  . COLONOSCOPY      Allergies  Allergen Reactions  . Oxycodone-Acetaminophen     REACTION: constipation  . Tetracycline Hcl     REACTION: rash    Family History  Adopted: Yes  Problem Relation Age of Onset  . Other Father        Deceased, sepsis  . Healthy Child   . Other Grandchild        1p36 syndrome     Social History   Socioeconomic History  . Marital status: Married    Spouse name: Not on file  . Number of children: Not on file  . Years of education: Not on file  . Highest education level: Not on file  Social Needs  . Financial  resource strain: Not on file  . Food insecurity - worry: Not on file  . Food insecurity - inability: Not on file  . Transportation needs - medical: Not on file  . Transportation needs - non-medical: Not on file  Occupational History  . Occupation: retired  Tobacco Use  . Smoking status: Former Smoker    Last attempt to quit: 09/12/1979    Years since quitting: 38.1  . Smokeless tobacco: Never Used  Substance and Sexual Activity  . Alcohol use: Yes    Alcohol/week: 0.0 oz    Comment: Drinks 3-4 beers nightly x 50 years  . Drug use: No  . Sexual activity: Yes  Other Topics Concern  . Not on file  Social History Narrative   Lives with wife in a 4 story home.  Has 3 children and 7 grandkids.  Retired from [employer].     Education: some college.       PHYSICAL EXAM:  VS: BP 132/76 (BP Location: Left Arm, Patient Position: Sitting, Cuff Size: Normal)   Pulse 75   Temp 97.7 F (36.5 C) (Oral)   Ht 5\' 8"  (1.727 m)   Wt 198 lb (89.8 kg)   SpO2 99%   BMI 30.11 kg/m  Physical Exam Gen: NAD, alert, cooperative with exam,  ENT: normal lips, normal nasal mucosa,  Eye: normal  EOM, normal conjunctiva and lids CV:  no edema, +2 pedal pulses   Resp: no accessory muscle use, non-labored,  GI: no masses or tenderness, no hernia, no distention   Skin: no rashes, no areas of induration  Neuro: normal tone, normal sensation to touch Psych:  normal insight, alert and oriented MSK: normal gait, normal strength      ASSESSMENT & PLAN:   Dysphagia Unclear as to the source of his dysphagia. No prior history. He was unable to tolerate a PO challenge of water in the clinic.  - advised to go to ER for evaluation. May need a scope by GI.

## 2017-10-11 NOTE — Op Note (Signed)
[facility] Patient Name: John Rowe Procedure Date: 10/11/2017 MRN: [id number] Attending MD: Milus Banister , MD Date of Birth: [birth year] CSN: [id number] Age: 72 Admit Type: Emergency Department Procedure:                Upper GI endoscopy Indications:              Dysphagia, Foreign body in the esophagus Providers:                Milus Banister, MD, Presley Raddle, RN, Laurena Spies, Technician, William Dalton, Technician Referring MD:              Medicines:                General Anesthesia Complications:            No immediate complications. Estimated blood loss:                            None. Estimated Blood Loss:     Estimated blood loss: none. Procedure:                Pre-Anesthesia Assessment:                           - Prior to the procedure, a History and Physical                            was performed, and patient medications and                            allergies were reviewed. The patient's tolerance of                            previous anesthesia was also reviewed. The risks                            and benefits of the procedure and the sedation                            options and risks were discussed with the patient.                            All questions were answered, and informed consent                            was obtained. Prior Anticoagulants: The patient has                            taken no previous anticoagulant or antiplatelet                            agents. ASA Grade Assessment: II - A patient with  mild systemic disease. After reviewing the risks                            and benefits, the patient was deemed in                            satisfactory condition to undergo the procedure.                           After obtaining informed consent, the endoscope was                            passed under direct vision. Throughout the          procedure, the patient's blood pressure, pulse, and                            oxygen saturations were monitored continuously. The                            Endoscope was introduced through the mouth, and                            advanced to the second part of duodenum. The upper                            GI endoscopy was accomplished without difficulty.                            The patient tolerated the procedure well. Scope In: Scope Out: Findings:      A medium sized food bolus was found in the distal esophagus, this was       bread-like, granular. Roth net and E-suction device were attempted but       were not very effective. Eventually I used a standard snare to break       about the bolus, lavage and suction to whittle it down small enough to       push it into the stomach.      There was some moderate friability, slight stricture and ulceration at       the GE junction that did not appear malignant.      The exam was otherwise without abnormality. Impression:               - Distal esophagus food impaction, removed.                           - Friable, slight stricture at the GE junction was                            not dilated today. Moderate Sedation:      N/A- Per Anesthesia Care Recommendation:           - Patient has a contact number available for                            emergencies. The signs and symptoms  of potential                            delayed complications were discussed with the                            patient. Return to normal activities tomorrow.                            Written discharge instructions were provided to the                            patient.                           - Twice daily PPI for now.                           - [facility] will contact you to arrange repeat EGD                            with dilation in 3-4 weeks.                           - Chew your food well, eat slowly and take small                             bites for now. Drink extra liquids with your                            pretzels indefinitely. Procedure Code(s):        --- Professional ---                           [phone], Esophagogastroduodenoscopy, flexible,                            transoral; diagnostic, including collection of                            specimen(s) by brushing or washing, when performed                            (separate procedure) Diagnosis Code(s):        --- Professional ---                           S01.093A, Food in esophagus causing other injury,                            initial encounter                           R13.10, Dysphagia, unspecified                           T18.108A, Unspecified foreign body  in esophagus                            causing other injury, initial encounter CPT copyright 2016 American Medical Association. All rights reserved. The codes documented in this report are preliminary and upon coder review may  be revised to meet current compliance requirements. Milus Banister, MD 10/11/2017 3:12:39 PM This report has been signed electronically. Number of Addenda: 0

## 2017-10-11 NOTE — ED Notes (Signed)
Patient speaking in full sentences without difficulty. Reports he is unable to maintain saliva. Denies SOB and difficulty breathing. States difficulty swallowing since "eating a bunch of little snacks last night."

## 2017-10-11 NOTE — Discharge Instructions (Signed)
YOU HAD AN ENDOSCOPIC PROCEDURE TODAY: Refer to the procedure report and other information in the discharge instructions given to you for any specific questions about what was found during the examination. If this information does not answer your questions, please call [place] office at [phone] to clarify.  ° °YOU SHOULD EXPECT: Some feelings of bloating in the abdomen. Passage of more gas than usual. Walking can help get rid of the air that was put into your GI tract during the procedure and reduce the bloating. If you had a lower endoscopy (such as a colonoscopy or flexible sigmoidoscopy) you may notice spotting of blood in your stool or on the toilet paper. Some abdominal soreness may be present for a day or two, also. ° °DIET: Your first meal following the procedure should be a light meal and then it is ok to progress to your normal diet. A half-sandwich or bowl of soup is an example of a good first meal. Heavy or fried foods are harder to digest and may make you feel nauseous or bloated. Drink plenty of fluids but you should avoid alcoholic beverages for 24 hours. If you had a esophageal dilation, please see attached instructions for diet.   ° °ACTIVITY: Your care partner should take you home directly after the procedure. You should plan to take it easy, moving slowly for the rest of the day. You can resume normal activity the day after the procedure however YOU SHOULD NOT DRIVE, use power tools, machinery or perform tasks that involve climbing or major physical exertion for 24 hours (because of the sedation medicines used during the test).  ° °SYMPTOMS TO REPORT IMMEDIATELY: °A gastroenterologist can be reached at any hour. Please call [phone]  for any of the following symptoms:  °Following lower endoscopy (colonoscopy, flexible sigmoidoscopy) °Excessive amounts of blood in the stool  °Significant tenderness, worsening of abdominal pains  °Swelling of the abdomen that is new, acute  °Fever of 100° or  higher  °Following upper endoscopy (EGD, EUS, ERCP, esophageal dilation) °Vomiting of blood or coffee ground material  °New, significant abdominal pain  °New, significant chest pain or pain under the shoulder blades  °Painful or persistently difficult swallowing  °New shortness of breath  °Black, tarry-looking or red, bloody stools ° °FOLLOW UP:  °If any biopsies were taken you will be contacted by phone or by letter within the next 1-3 weeks. Call [phone]  if you have not heard about the biopsies in 3 weeks.  °Please also call with any specific questions about appointments or follow up tests. ° °

## 2017-10-12 ENCOUNTER — Encounter (HOSPITAL_COMMUNITY): Payer: Self-pay | Admitting: Gastroenterology

## 2017-11-01 ENCOUNTER — Telehealth: Payer: Self-pay

## 2017-11-01 NOTE — Telephone Encounter (Signed)
The pt has been of the pre visit and EGD and will call with any concerns

## 2017-11-01 NOTE — Telephone Encounter (Signed)
-----   Message from Jeoffrey Massed, RN sent at 10/11/2017  3:20 PM EST -----   ----- Message ----- From: Milus Banister, MD Sent: 10/11/2017   3:15 PM To: Jeoffrey Massed, RN  He needs repeat EGD with me at [facility] in 2-4 weeks for recent food impaction, EGD with balloon dilation.  Thanks

## 2017-12-14 ENCOUNTER — Encounter: Payer: Self-pay | Admitting: Gastroenterology

## 2017-12-14 ENCOUNTER — Ambulatory Visit (AMBULATORY_SURGERY_CENTER): Payer: Self-pay | Admitting: *Deleted

## 2017-12-14 ENCOUNTER — Other Ambulatory Visit: Payer: Self-pay

## 2017-12-14 VITALS — Ht 68.0 in | Wt 199.4 lb

## 2017-12-14 DIAGNOSIS — R1310 Dysphagia, unspecified: Secondary | ICD-10-CM

## 2017-12-14 NOTE — Progress Notes (Signed)
No egg or soy allergy known to patient  No issues with past sedation with any surgeries  or procedures, no intubation problems  No diet pills per patient No home 02 use per patient  No blood thinners per patient  Pt denies issues with constipation  Not as regular  No A fib or A flutter  EMMI video sent to pt's e mail pt. declined

## 2017-12-21 ENCOUNTER — Other Ambulatory Visit: Payer: Self-pay

## 2017-12-21 ENCOUNTER — Ambulatory Visit (AMBULATORY_SURGERY_CENTER): Payer: Medicare Other | Admitting: Gastroenterology

## 2017-12-21 ENCOUNTER — Encounter: Payer: Self-pay | Admitting: Gastroenterology

## 2017-12-21 VITALS — BP 107/69 | HR 60 | Temp 97.3°F | Resp 12 | Ht 68.0 in | Wt 199.0 lb

## 2017-12-21 DIAGNOSIS — K449 Diaphragmatic hernia without obstruction or gangrene: Secondary | ICD-10-CM | POA: Diagnosis not present

## 2017-12-21 DIAGNOSIS — K222 Esophageal obstruction: Secondary | ICD-10-CM

## 2017-12-21 DIAGNOSIS — R1310 Dysphagia, unspecified: Secondary | ICD-10-CM

## 2017-12-21 MED ORDER — SODIUM CHLORIDE 0.9 % IV SOLN: 500.0000 mL | Freq: Once | INTRAVENOUS | Status: DC

## 2017-12-21 NOTE — Progress Notes (Signed)
Report to PACU, RN, vss, BBS= Clear.  

## 2017-12-21 NOTE — Op Note (Signed)
[facility] Patient Name: John Rowe Procedure Date: 12/21/2017 9:17 AM MRN: [id number] Endoscopist: Milus Banister , MD Age: 72 Referring MD:  Date of Birth: 1946/05/15 Gender: Male Account #: [id number] Procedure:                Upper GI endoscopy Indications:              Acute esophageal food impaction 2 months ago, EGD                            Dr. Ardis Hughs; since then he's been on once daily PPI. Medicines:                Monitored Anesthesia Care Procedure:                Pre-Anesthesia Assessment:                           - Prior to the procedure, a History and Physical                            was performed, and patient medications and                            allergies were reviewed. The patient's tolerance of                            previous anesthesia was also reviewed. The risks                            and benefits of the procedure and the sedation                            options and risks were discussed with the patient.                            All questions were answered, and informed consent                            was obtained. Prior Anticoagulants: The patient has                            taken no previous anticoagulant or antiplatelet                            agents. ASA Grade Assessment: II - A patient with                            mild systemic disease. After reviewing the risks                            and benefits, the patient was deemed in                            satisfactory condition to undergo the procedure.  After obtaining informed consent, the endoscope was                            passed under direct vision. Throughout the                            procedure, the patient's blood pressure, pulse, and                            oxygen saturations were monitored continuously. The                            Endoscope was introduced through the mouth, and                             advanced to the second part of duodenum. The upper                            GI endoscopy was accomplished without difficulty.                            The patient tolerated the procedure well. Scope In: Scope Out: Findings:                 One benign-appearing, intrinsic mild stenosis                            (focal peptic stricture vs. thick Schatzki's type                            ring)was found at the gastroesophageal junction. A                            TTS dilator was passed through the scope. Dilation                            with an 18-19-20 mm balloon dilator was performed                            to 20 mm.                           A small hiatal hernia was present.                           The exam was otherwise without abnormality. Complications:            No immediate complications. Estimated blood loss:                            None. Estimated Blood Loss:     Estimated blood loss: none. 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 thick Schatzki's ring). Dilated to                            65mm.                           - Small hiatal hernia.                           - The examination was otherwise normal. Recommendation:           - Patient has a contact number available for                            emergencies. The signs and symptoms of potential                            delayed complications were discussed with the                            patient. Return to normal activities tomorrow.                            Written discharge instructions were provided to the                            patient.                           - Resume previous diet. Continue chewing your food                            well, eating slowly and taking small bites.                           - Continue present medications. Stay on once daily                            PPI. Milus Banister,  MD 12/21/2017 9:36:13 AM This report has been signed electronically.

## 2017-12-21 NOTE — Progress Notes (Signed)
Pt's states no medical or surgical changes since previsit or office visit. 

## 2017-12-21 NOTE — Patient Instructions (Signed)
Impression/recommendations:  GE junction stricture (handout given) Hiatal hernia (hiatal hernia) Dilation diet (handout given)  YOU HAD AN ENDOSCOPIC PROCEDURE TODAY AT [facility]:   Refer to the procedure report that was given to you for any specific questions about what was found during the examination.  If the procedure report does not answer your questions, please call your gastroenterologist to clarify.  If you requested that your care partner not be given the details of your procedure findings, then the procedure report has been included in a sealed envelope for you to review at your convenience later.  YOU SHOULD EXPECT: Some feelings of bloating in the abdomen. Passage of more gas than usual.  Walking can help get rid of the air that was put into your GI tract during the procedure and reduce the bloating. If you had a lower endoscopy (such as a colonoscopy or flexible sigmoidoscopy) you may notice spotting of blood in your stool or on the toilet paper. If you underwent a bowel prep for your procedure, you may not have a normal bowel movement for a few days.  Please Note:  You might notice some irritation and congestion in your nose or some drainage.  This is from the oxygen used during your procedure.  There is no need for concern and it should clear up in a day or so.  SYMPTOMS TO REPORT IMMEDIATELY:   Following upper endoscopy (EGD)  Vomiting of blood or coffee ground material  New chest pain or pain under the shoulder blades  Painful or persistently difficult swallowing  New shortness of breath  Fever of 100F or higher  Black, tarry-looking stools  For urgent or emergent issues, a gastroenterologist can be reached at any hour by calling [phone].   DIET:  We do recommend a small meal at first, but then you may proceed to your regular diet.  Drink plenty of fluids but you should avoid alcoholic beverages for 24 hours.  ACTIVITY:  You should plan to take  it easy for the rest of today and you should NOT DRIVE or use heavy machinery until tomorrow (because of the sedation medicines used during the test).    FOLLOW UP: Our staff will call the number listed on your records the next business day following your procedure to check on you and address any questions or concerns that you may have regarding the information given to you following your procedure. If we do not reach you, we will leave a message.  However, if you are feeling well and you are not experiencing any problems, there is no need to return our call.  We will assume that you have returned to your regular daily activities without incident.  If any biopsies were taken you will be contacted by phone or by letter within the next 1-3 weeks.  Please call us at [phone] if you have not heard about the biopsies in 3 weeks.    SIGNATURES/CONFIDENTIALITY: You and/or your care partner have signed paperwork which will be entered into your electronic medical record.  These signatures attest to the fact that that the information above on your After Visit Summary has been reviewed and is understood.  Full responsibility of the confidentiality of this discharge information lies with you and/or your care-partner.

## 2017-12-21 NOTE — Progress Notes (Signed)
Called to room to assist during endoscopic procedure.  Patient ID and intended procedure confirmed with present staff. Received instructions for my participation in the procedure from the performing physician.  

## 2017-12-24 ENCOUNTER — Telehealth: Payer: Self-pay

## 2017-12-24 NOTE — Telephone Encounter (Signed)
  Follow up Call-  Call back number 12/21/2017  Post procedure Call Back phone  # [phone]  Permission to leave phone message Yes  Some recent data might be hidden     Patient questions:  Do you have a fever, pain , or abdominal swelling? No. Pain Score  0 *  Have you tolerated food without any problems? Yes.    Have you been able to return to your normal activities? Yes.    Do you have any questions about your discharge instructions: Diet   No. Medications  Yes.  Questioned how to take Omeprazole.  Advised to take 26minutes before meal on an empty stomach.  Pt agreed this is how he's been taking it.  He mentioned needing a Rx for #30 rather than #60. Follow up visit  No.  Do you have questions or concerns about your Care? No.  Actions: * If pain score is 4 or above: No action needed, pain <4.

## 2018-01-01 ENCOUNTER — Telehealth: Payer: Self-pay | Admitting: Internal Medicine

## 2018-01-01 DIAGNOSIS — Z1211 Encounter for screening for malignant neoplasm of colon: Secondary | ICD-10-CM

## 2018-01-01 NOTE — Telephone Encounter (Signed)
Last Cologurad: 7/14/ 2015 LOV: 1/ 23/ 2019 Next appt: 7/ 23/ 2019 Would you like to reorder cologuard?

## 2018-01-02 NOTE — Telephone Encounter (Signed)
Cologuard can be ordered with Dr. Judeen Hammans approval.

## 2018-01-04 NOTE — Telephone Encounter (Signed)
Ok if ok w/pt Thx

## 2018-01-16 NOTE — Telephone Encounter (Signed)
ordered

## 2018-02-10 ENCOUNTER — Other Ambulatory Visit: Payer: Self-pay | Admitting: Gastroenterology

## 2018-03-28 LAB — COLOGUARD: Cologuard: NEGATIVE

## 2018-04-01 NOTE — Progress Notes (Addendum)
Subjective:   John Rowe is a 72 y.o. male who presents for Medicare Annual/Subsequent preventive examination.  Review of Systems:  No ROS.  Medicare Wellness Visit. Additional risk factors are reflected in the social history.  Cardiac Risk Factors include: advanced age (>23men, >21 women);dyslipidemia;hypertension;male gender Sleep patterns: has interrupted sleep, does not get up to void and sleeps 6-7 hours nightly.    Home Safety/Smoke Alarms: Feels safe in home. Smoke alarms in place.  Living environment; residence and Firearm Safety: 2-story house, no firearms. Lives with wife, no needs for DME, good support system Seat Belt Safety/Bike Helmet: Wears seat belt.     Objective:    Vitals: BP 126/82   Pulse 75   Resp 18   Ht 5\' 8"  (1.727 m)   Wt 196 lb (88.9 kg)   SpO2 99%   BMI 29.80 kg/m   Body mass index is 29.8 kg/m.  Advanced Directives 04/02/2018 12/21/2017 10/11/2017  Does Patient Have a Medical Advance Directive? No No No  Would patient like information on creating a medical advance directive? No - Patient declined - -    Tobacco Social History   Tobacco Use  Smoking Status Former Smoker  . Last attempt to quit: 09/12/1979  . Years since quitting: 38.5  Smokeless Tobacco Never Used     Counseling given: Not Answered  Past Medical History:  Diagnosis Date  . Arthritis   . Cataract    beginning stage  both eyes  . Chest pain   . Hearing loss of left ear   . Hyperlipidemia   . Hypertension   . Meniere disease    Past Surgical History:  Procedure Laterality Date  . COLONOSCOPY    . ESOPHAGOGASTRODUODENOSCOPY (EGD) WITH PROPOFOL N/A 10/11/2017   Procedure: ESOPHAGOGASTRODUODENOSCOPY (EGD) WITH PROPOFOL;  Surgeon: Milus Banister, MD;  Location: [facility];  Service: Endoscopy;  Laterality: N/A;  . POLYPECTOMY    . UPPER GASTROINTESTINAL ENDOSCOPY     Family History  Adopted: Yes  Problem Relation Age of Onset  . Other Father        Deceased,  sepsis  . Healthy Child   . Other Grandchild        1p36 syndrome  . Colon polyps Neg Hx   . Colon cancer Neg Hx   . Esophageal cancer Neg Hx   . Stomach cancer Neg Hx   . Rectal cancer Neg Hx    Social History   Socioeconomic History  . Marital status: Married    Spouse name: Not on file  . Number of children: 3  . Years of education: Not on file  . Highest education level: Not on file  Occupational History  . Occupation: retired  Scientific laboratory technician  . Financial resource strain: Not hard at all  . Food insecurity:    Worry: Never true    Inability: Never true  . Transportation needs:    Medical: No    Non-medical: No  Tobacco Use  . Smoking status: Former Smoker    Last attempt to quit: 09/12/1979    Years since quitting: 38.5  . Smokeless tobacco: Never Used  Substance and Sexual Activity  . Alcohol use: Yes    Alcohol/week: 0.0 oz    Comment: Drinks 3-4 beers nightly x 50 years  . Drug use: No  . Sexual activity: Yes  Lifestyle  . Physical activity:    Days per week: 5 days    Minutes per session: 50 min  .  Stress: Not at all  Relationships  . Social connections:    Talks on phone: More than three times a week    Gets together: More than three times a week    Attends religious service: More than 4 times per year    Active member of club or organization: Not on file    Attends meetings of clubs or organizations: Never    Relationship status: Married  Other Topics Concern  . Not on file  Social History Narrative   Lives with wife in a 4 story home.  Has 3 children and 7 grandkids.  Retired from [employer].     Education: some college.      Outpatient Encounter Medications as of 04/02/2018  Medication Sig  . aspirin EC 81 MG tablet Take 81 mg by mouth daily.  . benazepril (LOTENSIN) 20 MG tablet TAKE ONE-HALF TABLET BY MOUTH DAILY  . pravastatin (PRAVACHOL) 20 MG tablet TAKE 1 TABLET BY MOUTH DAILY  . [DISCONTINUED] omeprazole (PRILOSEC) 40 MG capsule TAKE 1 CAPSULE  (40 MG TOTAL) BY MOUTH 2 (TWO) TIMES DAILY BEFORE A MEAL. (Patient not taking: Reported on 04/02/2018)   Facility-Administered Encounter Medications as of 04/02/2018  Medication  . 0.9 %  sodium chloride infusion    Activities of Daily Living In your present state of health, do you have any difficulty performing the following activities: 04/02/2018  Hearing? Y  Vision? N  Difficulty concentrating or making decisions? N  Walking or climbing stairs? N  Dressing or bathing? N  Doing errands, shopping? N  Preparing Food and eating ? N  Using the Toilet? N  In the past six months, have you accidently leaked urine? N  Do you have problems with loss of bowel control? N  Managing your Medications? N  Managing your Finances? N  Housekeeping or managing your Housekeeping? N  Some recent data might be hidden    Patient Care Team: Plotnikov, Evie Lacks, MD as PCP - General (Internal Medicine)   Assessment:   This is a routine wellness examination for John Rowe. Physical assessment deferred to PCP.   Exercise Activities and Dietary recommendations Current Exercise Habits: Home exercise routine, Type of exercise: walking(golf, activities with the [employer]), Time (Minutes): 55, Frequency (Times/Week): 5, Weekly Exercise (Minutes/Week): 275, Intensity: Mild  Diet (meal preparation, eat out, water intake, caffeinated beverages, dairy products, fruits and vegetables): in general, a "healthy" diet  , well balanced   Reviewed heart healthy diet. Encouraged patient to increase daily water and fluid intake.  Goals    . Patient Stated     Stay as healthy and as independent as possible. Continue to be active physically and socially. Enjoy life and family.       Fall Risk Fall Risk  04/02/2018 04/02/2017 10/04/2015 09/27/2015 03/25/2015  Falls in the past year? Yes No No No No    Depression Screen PHQ 2/9 Scores 04/02/2018 04/02/2017 09/27/2015 08/25/2013  PHQ - 2 Score 0 0 0 0    Cognitive  Function       Ad8 score reviewed for issues:  Issues making decisions: no  Less interest in hobbies / activities: no  Repeats questions, stories (family complaining): no  Trouble using ordinary gadgets (microwave, computer, phone):no  Forgets the month or year: no  Mismanaging finances: no  Remembering appts: no  Daily problems with thinking and/or memory: no Ad8 score is= 0  Immunization History  Administered Date(s) Administered  . Influenza, High Dose Seasonal PF 09/25/2016  .  Influenza,inj,Quad PF,6+ Mos 09/24/2014  . Pneumococcal Conjugate-13 09/24/2014  . Pneumococcal Polysaccharide-23 04/06/2010  . Td 09/11/2005   Screening Tests Health Maintenance  Topic Date Due  . Hepatitis C Screening  [birth year]  . TETANUS/TDAP  09/12/2015  . Fecal DNA (Cologuard)  03/30/2017  . INFLUENZA VACCINE  06/13/2018 (Originally 04/11/2018)  . PNA vac Low Risk Adult  Completed      Plan:     States he has sent a recent Cologuard in for screening.   Continue doing brain stimulating activities (puzzles, reading, adult coloring books, staying active) to keep memory sharp.   Continue to eat heart healthy diet (full of fruits, vegetables, whole grains, lean protein, water--limit salt, fat, and sugar intake) and increase physical activity as tolerated.  I have personally reviewed and noted the following in the patient's chart:   . Medical and social history . Use of alcohol, tobacco or illicit drugs  . Current medications and supplements . Functional ability and status . Nutritional status . Physical activity . Advanced directives . List of other physicians . Vitals . Screenings to include cognitive, depression, and falls . Referrals and appointments  In addition, I have reviewed and discussed with patient certain preventive protocols, quality metrics, and best practice recommendations. A written personalized care plan for preventive services as well as general preventive  health recommendations were provided to patient.     Michiel Cowboy, RN  04/02/2018  Medical screening examination/treatment/procedure(s) were performed by non-physician practitioner and as supervising physician I was immediately available for consultation/collaboration. I agree with above. Lew Dawes, MD

## 2018-04-02 ENCOUNTER — Encounter: Payer: Self-pay | Admitting: Internal Medicine

## 2018-04-02 ENCOUNTER — Ambulatory Visit (INDEPENDENT_AMBULATORY_CARE_PROVIDER_SITE_OTHER): Payer: Medicare Other | Admitting: Internal Medicine

## 2018-04-02 ENCOUNTER — Ambulatory Visit (INDEPENDENT_AMBULATORY_CARE_PROVIDER_SITE_OTHER): Payer: Medicare Other | Admitting: *Deleted

## 2018-04-02 VITALS — BP 126/82 | HR 75 | Ht 68.0 in | Wt 196.0 lb

## 2018-04-02 VITALS — BP 126/82 | HR 75 | Resp 18 | Ht 68.0 in | Wt 196.0 lb

## 2018-04-02 DIAGNOSIS — E538 Deficiency of other specified B group vitamins: Secondary | ICD-10-CM

## 2018-04-02 DIAGNOSIS — C44300 Unspecified malignant neoplasm of skin of unspecified part of face: Secondary | ICD-10-CM

## 2018-04-02 DIAGNOSIS — I10 Essential (primary) hypertension: Secondary | ICD-10-CM | POA: Diagnosis not present

## 2018-04-02 DIAGNOSIS — E785 Hyperlipidemia, unspecified: Secondary | ICD-10-CM

## 2018-04-02 DIAGNOSIS — Z0000 Encounter for general adult medical examination without abnormal findings: Secondary | ICD-10-CM

## 2018-04-02 DIAGNOSIS — H8109 Meniere's disease, unspecified ear: Secondary | ICD-10-CM | POA: Diagnosis not present

## 2018-04-02 DIAGNOSIS — R739 Hyperglycemia, unspecified: Secondary | ICD-10-CM

## 2018-04-02 DIAGNOSIS — N320 Bladder-neck obstruction: Secondary | ICD-10-CM

## 2018-04-02 MED ORDER — PRAVASTATIN SODIUM 20 MG PO TABS: 20.0000 mg | ORAL_TABLET | Freq: Every day | ORAL | 3 refills | Status: DC

## 2018-04-02 MED ORDER — BENAZEPRIL HCL 20 MG PO TABS: 10.0000 mg | ORAL_TABLET | Freq: Every day | ORAL | 3 refills | Status: DC

## 2018-04-02 NOTE — Patient Instructions (Addendum)
Continue doing brain stimulating activities (puzzles, reading, adult coloring books, staying active) to keep memory sharp.   Continue to eat heart healthy diet (full of fruits, vegetables, whole grains, lean protein, water--limit salt, fat, and sugar intake) and increase physical activity as tolerated.   [name] , Thank you for taking time to come for your Medicare Wellness Visit. I appreciate your ongoing commitment to your health goals. Please review the following plan we discussed and let me know if I can assist you in the future.   These are the goals we discussed: Goals    . Patient Stated     Stay as healthy and as independent as possible. Continue to be active physically and socially. Enjoy life and family.       This is a list of the screening recommended for you and due dates:  Health Maintenance  Topic Date Due  .  Hepatitis C: One time screening is recommended by Center for Disease Control  (CDC) for  adults born from [birth year] through 1967.   16-Dec-1945  . Tetanus Vaccine  09/12/2015  . Cologuard (Stool DNA test)  03/30/2017  . Flu Shot  06/13/2018*  . Pneumonia vaccines  Completed  *Topic was postponed. The date shown is not the original due date.     Health Maintenance, Male A healthy lifestyle and preventive care is important for your health and wellness. Ask your health care provider about what schedule of regular examinations is right for you. What should I know about weight and diet? Eat a Healthy Diet  Eat plenty of vegetables, fruits, whole grains, low-fat dairy products, and lean protein.  Do not eat a lot of foods high in solid fats, added sugars, or salt.  Maintain a Healthy Weight Regular exercise can help you achieve or maintain a healthy weight. You should:  Do at least 150 minutes of exercise each week. The exercise should increase your heart rate and make you sweat (moderate-intensity exercise).  Do strength-training exercises at least twice a  week.  Watch Your Levels of Cholesterol and Blood Lipids  Have your blood tested for lipids and cholesterol every 5 years starting at 74 years of age. If you are at high risk for heart disease, you should start having your blood tested when you are 74 years old. You may need to have your cholesterol levels checked more often if: ? Your lipid or cholesterol levels are high. ? You are older than 74 years of age. ? You are at high risk for heart disease.  What should I know about cancer screening? Many types of cancers can be detected early and may often be prevented. Lung Cancer  You should be screened every year for lung cancer if: ? You are a current smoker who has smoked for at least 30 years. ? You are a former smoker who has quit within the past 15 years.  Talk to your health care provider about your screening options, when you should start screening, and how often you should be screened.  Colorectal Cancer  Routine colorectal cancer screening usually begins at 74 years of age and should be repeated every 5-10 years until you are 74 years old. You may need to be screened more often if early forms of precancerous polyps or small growths are found. Your health care provider may recommend screening at an earlier age if you have risk factors for colon cancer.  Your health care provider may recommend using home test kits to check for  hidden blood in the stool.  A small camera at the end of a tube can be used to examine your colon (sigmoidoscopy or colonoscopy). This checks for the earliest forms of colorectal cancer.  Prostate and Testicular Cancer  Depending on your age and overall health, your health care provider may do certain tests to screen for prostate and testicular cancer.  Talk to your health care provider about any symptoms or concerns you have about testicular or prostate cancer.  Skin Cancer  Check your skin from head to toe regularly.  Tell your health care provider  about any new moles or changes in moles, especially if: ? There is a change in a mole's size, shape, or color. ? You have a mole that is larger than a pencil eraser.  Always use sunscreen. Apply sunscreen liberally and repeat throughout the day.  Protect yourself by wearing long sleeves, pants, a wide-brimmed hat, and sunglasses when outside.  What should I know about heart disease, diabetes, and high blood pressure?  If you are 90-38 years of age, have your blood pressure checked every 3-5 years. If you are 21 years of age or older, have your blood pressure checked every year. You should have your blood pressure measured twice-once when you are at a hospital or clinic, and once when you are not at a hospital or clinic. Record the average of the two measurements. To check your blood pressure when you are not at a hospital or clinic, you can use: ? An automated blood pressure machine at a pharmacy. ? A home blood pressure monitor.  Talk to your health care provider about your target blood pressure.  If you are between 72-26 years old, ask your health care provider if you should take aspirin to prevent heart disease.  Have regular diabetes screenings by checking your fasting blood sugar level. ? If you are at a normal weight and have a low risk for diabetes, have this test once every three years after the age of 19. ? If you are overweight and have a high risk for diabetes, consider being tested at a younger age or more often.  A one-time screening for abdominal aortic aneurysm (AAA) by ultrasound is recommended for men aged 53-75 years who are current or former smokers. What should I know about preventing infection? Hepatitis B If you have a higher risk for hepatitis B, you should be screened for this virus. Talk with your health care provider to find out if you are at risk for hepatitis B infection. Hepatitis C Blood testing is recommended for:  Everyone born from [birth year] through  1965.  Anyone with known risk factors for hepatitis C.  Sexually Transmitted Diseases (STDs)  You should be screened each year for STDs including gonorrhea and chlamydia if: ? You are sexually active and are younger than 74 years of age. ? You are older than 74 years of age and your health care provider tells you that you are at risk for this type of infection. ? Your sexual activity has changed since you were last screened and you are at an increased risk for chlamydia or gonorrhea. Ask your health care provider if you are at risk.  Talk with your health care provider about whether you are at high risk of being infected with HIV. Your health care provider may recommend a prescription medicine to help prevent HIV infection.  What else can I do?  Schedule regular health, dental, and eye exams.  Stay current with your  vaccines (immunizations).  Do not use any tobacco products, such as cigarettes, chewing tobacco, and e-cigarettes. If you need help quitting, ask your health care provider.  Limit alcohol intake to no more than 2 drinks per day. One drink equals 12 ounces of beer, 5 ounces of wine, or 1 ounces of hard liquor.  Do not use street drugs.  Do not share needles.  Ask your health care provider for help if you need support or information about quitting drugs.  Tell your health care provider if you often feel depressed.  Tell your health care provider if you have ever been abused or do not feel safe at home. This information is not intended to replace advice given to you by your health care provider. Make sure you discuss any questions you have with your health care provider. Document Released: 02/24/2008 Document Revised: 04/26/2016 Document Reviewed: 06/01/2015 Elsevier Interactive Patient Education  [employer].

## 2018-04-02 NOTE — Assessment & Plan Note (Signed)
Diazepam prn 

## 2018-04-02 NOTE — Assessment & Plan Note (Signed)
Derm f/u 

## 2018-04-02 NOTE — Progress Notes (Signed)
Subjective:  Patient ID: John Rowe, male    DOB: 1946/05/28  Age: 72 y.o. MRN: [id number]  CC: No chief complaint on file.   HPI DEMETRES PROCHNOW presents for HTN, dyslipidemia, B12 def  Outpatient Medications Prior to Visit  Medication Sig Dispense Refill  . aspirin EC 81 MG tablet Take 81 mg by mouth daily.    . benazepril (LOTENSIN) 20 MG tablet TAKE ONE-HALF TABLET BY MOUTH DAILY 135 tablet 1  . pravastatin (PRAVACHOL) 20 MG tablet TAKE 1 TABLET BY MOUTH DAILY 30 tablet 11   Facility-Administered Medications Prior to Visit  Medication Dose Route Frequency Provider Last Rate Last Dose  . 0.9 %  sodium chloride infusion  500 mL Intravenous Once Milus Banister, MD        ROS: Review of Systems  Constitutional: Negative for appetite change, fatigue and unexpected weight change.  HENT: Negative for congestion, nosebleeds, sneezing, sore throat and trouble swallowing.   Eyes: Negative for itching and visual disturbance.  Respiratory: Negative for cough.   Cardiovascular: Negative for chest pain, palpitations and leg swelling.  Gastrointestinal: Negative for abdominal distention, blood in stool, diarrhea and nausea.  Genitourinary: Negative for frequency and hematuria.  Musculoskeletal: Negative for back pain, gait problem, joint swelling and neck pain.  Skin: Negative for rash.  Neurological: Negative for dizziness, tremors, speech difficulty and weakness.  Psychiatric/Behavioral: Negative for agitation, dysphoric mood and sleep disturbance. The patient is not nervous/anxious.     Objective:  BP 126/82   Pulse 75   Ht 5\' 8"  (1.727 m)   Wt 196 lb (88.9 kg)   SpO2 99%   BMI 29.80 kg/m   BP Readings from Last 3 Encounters:  04/02/18 126/82  04/02/18 126/82  12/21/17 107/69    Wt Readings from Last 3 Encounters:  04/02/18 196 lb (88.9 kg)  04/02/18 196 lb (88.9 kg)  12/21/17 199 lb (90.3 kg)    Physical Exam  Constitutional: He is oriented to person, place,  and time. He appears well-developed. No distress.  NAD  HENT:  Mouth/Throat: Oropharynx is clear and moist.  Eyes: Pupils are equal, round, and reactive to light. Conjunctivae are normal.  Neck: Normal range of motion. No JVD present. No thyromegaly present.  Cardiovascular: Normal rate, regular rhythm, normal heart sounds and intact distal pulses. Exam reveals no gallop and no friction rub.  No murmur heard. Pulmonary/Chest: Effort normal and breath sounds normal. No respiratory distress. He has no wheezes. He has no rales. He exhibits no tenderness.  Abdominal: Soft. Bowel sounds are normal. He exhibits no distension and no mass. There is no tenderness. There is no rebound and no guarding.  Musculoskeletal: Normal range of motion. He exhibits tenderness. He exhibits no edema.  Lymphadenopathy:    He has no cervical adenopathy.  Neurological: He is alert and oriented to person, place, and time. He has normal reflexes. No cranial nerve deficit. He exhibits normal muscle tone. He displays a negative Romberg sign. Coordination and gait normal.  Skin: Skin is warm and dry. No rash noted.  Psychiatric: He has a normal mood and affect. His behavior is normal. Judgment and thought content normal.  acne on nose  Lab Results  Component Value Date   WBC 9.9 09/25/2016   HGB 14.1 09/25/2016   HCT 40.7 09/25/2016   PLT 195.0 09/25/2016   GLUCOSE 105 (H) 10/03/2017   CHOL 171 09/27/2015   TRIG 93.0 09/27/2015   HDL 56.50 09/27/2015  LDLDIRECT 145.8 07/01/2010   LDLCALC 96 09/27/2015   ALT 15 09/27/2015   AST 18 09/27/2015   NA 139 10/03/2017   K 4.6 10/03/2017   CL 102 10/03/2017   CREATININE 1.23 10/03/2017   BUN 16 10/03/2017   CO2 24 10/03/2017   TSH 1.02 09/27/2015   PSA 0.21 09/27/2015   INR 1.0 RATIO 06/26/2006   HGBA1C 5.8 10/03/2017    No results found.  Assessment & Plan:   There are no diagnoses linked to this encounter.   No orders of the defined types were placed  in this encounter.    Follow-up: No follow-ups on file.  Walker Kehr, MD

## 2018-04-02 NOTE — Assessment & Plan Note (Signed)
Lotensin

## 2018-04-02 NOTE — Assessment & Plan Note (Signed)
B 12

## 2018-04-02 NOTE — Assessment & Plan Note (Signed)
Labs

## 2018-06-12 ENCOUNTER — Telehealth: Payer: Self-pay | Admitting: Internal Medicine

## 2018-06-12 NOTE — Telephone Encounter (Signed)
Patient is requesting a script for metronidazole to be sent to [facility] at [facility].  States Plot prescribed this for him back in January but patient did not get filled due to cost.  Patient states he wants to fill this script now.

## 2018-06-13 NOTE — Telephone Encounter (Signed)
last prescribed 09/2015, please advise

## 2018-06-16 ENCOUNTER — Other Ambulatory Visit: Payer: Self-pay | Admitting: Internal Medicine

## 2018-06-16 MED ORDER — METRONIDAZOLE 1 % EX GEL: Freq: Every day | CUTANEOUS | 3 refills | Status: DC

## 2018-06-16 NOTE — Telephone Encounter (Signed)
The prescription was emailed.  Thank you

## 2018-10-07 ENCOUNTER — Ambulatory Visit (INDEPENDENT_AMBULATORY_CARE_PROVIDER_SITE_OTHER): Payer: Medicare Other | Admitting: Internal Medicine

## 2018-10-07 ENCOUNTER — Encounter: Payer: Self-pay | Admitting: Internal Medicine

## 2018-10-07 ENCOUNTER — Other Ambulatory Visit (INDEPENDENT_AMBULATORY_CARE_PROVIDER_SITE_OTHER): Payer: Medicare Other

## 2018-10-07 VITALS — BP 118/78 | HR 67 | Temp 98.0°F | Ht 68.0 in | Wt 194.0 lb

## 2018-10-07 DIAGNOSIS — H6123 Impacted cerumen, bilateral: Secondary | ICD-10-CM

## 2018-10-07 DIAGNOSIS — Z0001 Encounter for general adult medical examination with abnormal findings: Secondary | ICD-10-CM

## 2018-10-07 DIAGNOSIS — R739 Hyperglycemia, unspecified: Secondary | ICD-10-CM | POA: Diagnosis not present

## 2018-10-07 DIAGNOSIS — Z23 Encounter for immunization: Secondary | ICD-10-CM | POA: Diagnosis not present

## 2018-10-07 DIAGNOSIS — Z0000 Encounter for general adult medical examination without abnormal findings: Secondary | ICD-10-CM

## 2018-10-07 DIAGNOSIS — N320 Bladder-neck obstruction: Secondary | ICD-10-CM

## 2018-10-07 DIAGNOSIS — L570 Actinic keratosis: Secondary | ICD-10-CM

## 2018-10-07 DIAGNOSIS — L719 Rosacea, unspecified: Secondary | ICD-10-CM

## 2018-10-07 DIAGNOSIS — E785 Hyperlipidemia, unspecified: Secondary | ICD-10-CM

## 2018-10-07 DIAGNOSIS — I10 Essential (primary) hypertension: Secondary | ICD-10-CM | POA: Diagnosis not present

## 2018-10-07 LAB — LIPID PANEL
Cholesterol: 185 mg/dL (ref 0–200)
HDL: 60.3 mg/dL (ref >39.00)
LDL Cholesterol: 108 mg/dL — ABNORMAL HIGH (ref 0–99)
NonHDL: 124.6
Total CHOL/HDL Ratio: 3
Triglycerides: 84 mg/dL (ref 0.0–149.0)
VLDL: 16.8 mg/dL (ref 0.0–40.0)

## 2018-10-07 LAB — URINALYSIS
BILIRUBIN URINE: NEGATIVE
HGB URINE DIPSTICK: NEGATIVE
Ketones, ur: NEGATIVE
Leukocytes, UA: NEGATIVE
NITRITE: NEGATIVE
Specific Gravity, Urine: 1.01 (ref 1.000–1.030)
Total Protein, Urine: NEGATIVE
Urine Glucose: NEGATIVE
Urobilinogen, UA: 0.2 (ref 0.0–1.0)
pH: 6.5 (ref 5.0–8.0)

## 2018-10-07 LAB — BASIC METABOLIC PANEL
BUN: 14 mg/dL (ref 6–23)
CO2: 25 mEq/L (ref 19–32)
Calcium: 9.2 mg/dL (ref 8.4–10.5)
Chloride: 101 mEq/L (ref 96–112)
Creatinine, Ser: 1 mg/dL (ref 0.40–1.50)
GFR: 72.84 mL/min (ref >60.00)
Glucose, Bld: 104 mg/dL — ABNORMAL HIGH (ref 70–99)
Potassium: 4.1 mEq/L (ref 3.5–5.1)
SODIUM: 136 meq/L (ref 135–145)

## 2018-10-07 LAB — CBC WITH DIFFERENTIAL/PLATELET
Basophils Absolute: 0 10*3/uL (ref 0.0–0.1)
Basophils Relative: 0.2 % (ref 0.0–3.0)
EOS PCT: 2.1 % (ref 0.0–5.0)
Eosinophils Absolute: 0.2 10*3/uL (ref 0.0–0.7)
HCT: 42.8 % (ref 39.0–52.0)
Hemoglobin: 14.6 g/dL (ref 13.0–17.0)
Lymphocytes Relative: 30.1 % (ref 12.0–46.0)
Lymphs Abs: 3.2 10*3/uL (ref 0.7–4.0)
MCHC: 34.1 g/dL (ref 30.0–36.0)
MCV: 97 fl (ref 78.0–100.0)
Monocytes Absolute: 1.1 10*3/uL — ABNORMAL HIGH (ref 0.1–1.0)
Monocytes Relative: 9.9 % (ref 3.0–12.0)
NEUTROS PCT: 57.7 % (ref 43.0–77.0)
Neutro Abs: 6.2 10*3/uL (ref 1.4–7.7)
Platelets: 218 10*3/uL (ref 150.0–400.0)
RBC: 4.42 Mil/uL (ref 4.22–5.81)
RDW: 13.9 % (ref 11.5–15.5)
WBC: 10.7 10*3/uL — ABNORMAL HIGH (ref 4.0–10.5)

## 2018-10-07 LAB — HEPATIC FUNCTION PANEL
ALT: 17 U/L (ref 0–53)
AST: 20 U/L (ref 0–37)
Albumin: 4.3 g/dL (ref 3.5–5.2)
Alkaline Phosphatase: 63 U/L (ref 39–117)
Bilirubin, Direct: 0.1 mg/dL (ref 0.0–0.3)
Total Bilirubin: 0.7 mg/dL (ref 0.2–1.2)
Total Protein: 6.7 g/dL (ref 6.0–8.3)

## 2018-10-07 LAB — HEMOGLOBIN A1C: Hgb A1c MFr Bld: 5.6 % (ref 4.6–6.5)

## 2018-10-07 LAB — VITAMIN B12: Vitamin B-12: 206 pg/mL — ABNORMAL LOW (ref 211–911)

## 2018-10-07 LAB — TSH: TSH: 1.17 u[IU]/mL (ref 0.35–4.50)

## 2018-10-07 LAB — PSA: PSA: 0.25 ng/mL (ref 0.10–4.00)

## 2018-10-07 NOTE — Assessment & Plan Note (Signed)
Will irrigate B ears

## 2018-10-07 NOTE — Addendum Note (Signed)
Addended by: Karren Cobble on: 10/07/2018 09:54 AM   Modules accepted: Orders

## 2018-10-07 NOTE — Assessment & Plan Note (Signed)
cardiac CT scan for calcium scoring offered 

## 2018-10-07 NOTE — Assessment & Plan Note (Signed)
A1c

## 2018-10-07 NOTE — Assessment & Plan Note (Signed)
Derm ref Dr Rosana Hoes

## 2018-10-07 NOTE — Progress Notes (Signed)
Subjective:  Patient ID: John Rowe, male    DOB: [birth year]  Age: 73 y.o. MRN: [id number]  CC: No chief complaint on file.   HPI John Rowe presents for a well exam F/u dyslipidemia, HTN, B12 def f/u  Outpatient Medications Prior to Visit  Medication Sig Dispense Refill  . aspirin EC 81 MG tablet Take 81 mg by mouth daily.    . benazepril (LOTENSIN) 20 MG tablet Take 0.5 tablets (10 mg total) by mouth daily. 135 tablet 3  . metroNIDAZOLE (METROGEL) 1 % gel Apply topically daily. 60 g 3  . pravastatin (PRAVACHOL) 20 MG tablet Take 1 tablet (20 mg total) by mouth daily. 90 tablet 3   Facility-Administered Medications Prior to Visit  Medication Dose Route Frequency Provider Last Rate Last Dose  . 0.9 %  sodium chloride infusion  500 mL Intravenous Once Milus Banister, MD        ROS: Review of Systems  Constitutional: Negative for appetite change, fatigue and unexpected weight change.  HENT: Negative for congestion, nosebleeds, sneezing, sore throat and trouble swallowing.   Eyes: Negative for itching and visual disturbance.  Respiratory: Negative for cough.   Cardiovascular: Negative for chest pain, palpitations and leg swelling.  Gastrointestinal: Negative for abdominal distention, blood in stool, diarrhea and nausea.  Genitourinary: Negative for frequency and hematuria.  Musculoskeletal: Negative for back pain, gait problem, joint swelling and neck pain.  Skin: Negative for rash.  Neurological: Negative for dizziness, tremors, speech difficulty and weakness.  Psychiatric/Behavioral: Negative for agitation, dysphoric mood and sleep disturbance. The patient is not nervous/anxious.     Objective:  There were no vitals taken for this visit.  BP Readings from Last 3 Encounters:  04/02/18 126/82  04/02/18 126/82  12/21/17 107/69    Wt Readings from Last 3 Encounters:  04/02/18 196 lb (88.9 kg)  04/02/18 196 lb (88.9 kg)  12/21/17 199 lb (90.3 kg)    Physical  Exam Constitutional:      General: He is not in acute distress.    Appearance: He is well-developed.     Comments: NAD  HENT:     Right Ear: There is impacted cerumen.     Left Ear: There is impacted cerumen.  Eyes:     Conjunctiva/sclera: Conjunctivae normal.     Pupils: Pupils are equal, round, and reactive to light.  Neck:     Musculoskeletal: Normal range of motion.     Thyroid: No thyromegaly.     Vascular: No JVD.  Cardiovascular:     Rate and Rhythm: Normal rate and regular rhythm.     Heart sounds: Normal heart sounds. No murmur. No friction rub. No gallop.   Pulmonary:     Effort: Pulmonary effort is normal. No respiratory distress.     Breath sounds: Normal breath sounds. No wheezing or rales.  Chest:     Chest wall: No tenderness.  Abdominal:     General: Bowel sounds are normal. There is no distension.     Palpations: Abdomen is soft. There is no mass.     Tenderness: There is no abdominal tenderness. There is no guarding or rebound.  Genitourinary:    Prostate: Normal.     Rectum: Normal. Guaiac result negative.  Musculoskeletal: Normal range of motion.        General: No tenderness.  Lymphadenopathy:     Cervical: No cervical adenopathy.  Skin:    General: Skin is warm and dry.  Findings: No rash.  Neurological:     Mental Status: He is alert and oriented to person, place, and time.     Cranial Nerves: No cranial nerve deficit.     Motor: No abnormal muscle tone.     Coordination: Coordination normal.     Gait: Gait normal.     Deep Tendon Reflexes: Reflexes are normal and symmetric.  Psychiatric:        Behavior: Behavior normal.        Thought Content: Thought content normal.        Judgment: Judgment normal.   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 Verbal consent was obtained and the patient agreed to proceed.    We used "The Elephant Ear Irrigation Device" filled with lukewarm water for irrigation. A large amount wax was recovered from both ears. Procedure has also required manual wax removal/instrumentation with an ear wax curette and ear forceps on the right and left ears.   Tolerated well. Complications: None.   Postprocedure instructions :  Call if problems.    Lab Results  Component Value Date   WBC 9.9 09/25/2016   HGB 14.1 09/25/2016   HCT 40.7 09/25/2016   PLT 195.0 09/25/2016   GLUCOSE 105 (H) 10/03/2017   CHOL 171 09/27/2015   TRIG 93.0 09/27/2015   HDL 56.50 09/27/2015   LDLDIRECT 145.8 07/01/2010   LDLCALC 96 09/27/2015   ALT 15 09/27/2015   AST 18 09/27/2015   NA 139 10/03/2017   K 4.6 10/03/2017   CL 102 10/03/2017   CREATININE 1.23 10/03/2017   BUN 16 10/03/2017   CO2 24 10/03/2017   TSH 1.02 09/27/2015   PSA 0.21 09/27/2015   INR 1.0 RATIO 06/26/2006   HGBA1C 5.8 10/03/2017    No results found.  Assessment & Plan:   There are no diagnoses linked to this encounter.   No orders of the defined types were placed in this encounter.    Follow-up: No follow-ups on file.  Walker Kehr, MD

## 2018-10-07 NOTE — Patient Instructions (Signed)

## 2018-10-09 ENCOUNTER — Other Ambulatory Visit: Payer: Self-pay | Admitting: Internal Medicine

## 2018-10-09 MED ORDER — VITAMIN B-12 1000 MCG SL SUBL: 1.0000 | SUBLINGUAL_TABLET | Freq: Every day | SUBLINGUAL | 3 refills | Status: AC

## 2018-10-09 MED ORDER — VITAMIN D 25 MCG (1000 UNIT) PO TABS: 1000.0000 [IU] | ORAL_TABLET | Freq: Every day | ORAL | 11 refills | Status: DC

## 2018-10-10 ENCOUNTER — Ambulatory Visit (INDEPENDENT_AMBULATORY_CARE_PROVIDER_SITE_OTHER): Payer: Medicare Other

## 2018-10-10 DIAGNOSIS — E538 Deficiency of other specified B group vitamins: Secondary | ICD-10-CM

## 2018-10-10 MED ORDER — CYANOCOBALAMIN 1000 MCG/ML IJ SOLN
1000.0000 ug | Freq: Once | INTRAMUSCULAR | Status: AC
  Administered 2018-10-10: 1000 ug via INTRAMUSCULAR

## 2018-11-26 NOTE — Assessment & Plan Note (Signed)
Here for medicare wellness/physical  Diet: heart healthy  Physical activity: not sedentary  Depression/mood screen: negative  Hearing: a little decreased to whispered voice  Visual acuity: grossly normal, performs annual eye exam  ADLs: capable  Fall risk: none  Home safety: good  Cognitive evaluation: intact to orientation, naming, recall and repetition  EOL planning: adv directives, full code/ I agree  I have personally reviewed and have noted  1. The patient's medical, surgical and social history  2. Their use of alcohol, tobacco or illicit drugs  3. Their current medications and supplements  4. The patient's functional ability including ADL's, fall risks, home safety risks and hearing or visual impairment.  5. Diet and physical activities  6. Evidence for depression or mood disorders 7. The roster of all physicians providing medical care to patient - is listed in the Snapshot section of the chart and reviewed today.    Today patient counseled on age appropriate routine health concerns for screening and prevention, each reviewed and up to date or declined. Immunizations reviewed and up to date or declined. Labs ordered and reviewed. Risk factors for depression reviewed and negative. Hearing function and visual acuity are intact. ADLs screened and addressed as needed. Functional ability and level of safety reviewed and appropriate. Education, counseling and referrals performed based on assessed risks today. Patient provided with a copy of personalized plan for preventive services.

## 2019-03-31 ENCOUNTER — Other Ambulatory Visit: Payer: Self-pay | Admitting: Internal Medicine

## 2019-04-07 ENCOUNTER — Other Ambulatory Visit (INDEPENDENT_AMBULATORY_CARE_PROVIDER_SITE_OTHER): Payer: Medicare Other

## 2019-04-07 ENCOUNTER — Ambulatory Visit (INDEPENDENT_AMBULATORY_CARE_PROVIDER_SITE_OTHER): Payer: Medicare Other | Admitting: Internal Medicine

## 2019-04-07 ENCOUNTER — Other Ambulatory Visit: Payer: Self-pay

## 2019-04-07 ENCOUNTER — Encounter: Payer: Self-pay | Admitting: Internal Medicine

## 2019-04-07 DIAGNOSIS — C4331 Malignant melanoma of nose: Secondary | ICD-10-CM

## 2019-04-07 DIAGNOSIS — I10 Essential (primary) hypertension: Secondary | ICD-10-CM

## 2019-04-07 DIAGNOSIS — E785 Hyperlipidemia, unspecified: Secondary | ICD-10-CM | POA: Diagnosis not present

## 2019-04-07 DIAGNOSIS — E538 Deficiency of other specified B group vitamins: Secondary | ICD-10-CM

## 2019-04-07 DIAGNOSIS — R739 Hyperglycemia, unspecified: Secondary | ICD-10-CM

## 2019-04-07 LAB — BASIC METABOLIC PANEL
BUN: 17 mg/dL (ref 6–23)
CO2: 24 mEq/L (ref 19–32)
Calcium: 9.1 mg/dL (ref 8.4–10.5)
Chloride: 104 mEq/L (ref 96–112)
Creatinine, Ser: 1.07 mg/dL (ref 0.40–1.50)
GFR: 67.28 mL/min (ref >60.00)
Glucose, Bld: 134 mg/dL — ABNORMAL HIGH (ref 70–99)
Potassium: 4 mEq/L (ref 3.5–5.1)
Sodium: 136 mEq/L (ref 135–145)

## 2019-04-07 LAB — VITAMIN B12: Vitamin B-12: 841 pg/mL (ref 211–911)

## 2019-04-07 NOTE — Assessment & Plan Note (Signed)
CT Labs

## 2019-04-07 NOTE — Patient Instructions (Signed)
If you have medicare related insurance (such as traditional Medicare, Blue Cross Medicare, United Health[employer] Medicare, or similar), Please make an appointment at the scheduling desk with Jill, the Wellness Health Coach, for your Wellness visit in this office, which is a benefit with your insurance.    These suggestions will probably help you to improve your metabolism if you are not overweight and to lose weight if you are overweight: 1.  Reduce your consumption of sugars and starches.  Eliminate high fructose corn syrup from your diet.  Reduce your consumption of processed foods.  For desserts try to have seasonal fruits, berries, nuts, cheeses or dark chocolate with more than 70% cacao. 2.  Do not snack 3.  You do not have to eat breakfast.  If you choose to have breakfast-eat plain greek yogurt, eggs, oatmeal (without sugar) 4.  Drink water, freshly brewed unsweetened tea (green, black or herbal) or coffee.  Do not drink sodas including diet sodas , juices, beverages sweetened with artificial sweeteners. 5.  Reduce your consumption of refined grains. 6.  Avoid protein drinks such as Optifast, Slim fast etc. Eat chicken, fish, meat, dairy and beans for your sources of protein 7.  Natural unprocessed fats like cold pressed virgin olive oil, butter, coconut oil are good for you.  Eat avocados 8.  Increase your consumption of fiber.  Fruits, berries, vegetables, whole grains, flaxseeds, Chia seeds, beans, popcorn, nuts, oatmeal are good sources of fiber 9.  Use vinegar in your diet, i.e. apple cider vinegar, red wine or balsamic vinegar 10.  You can try fasting.  For example you can skip breakfast and lunch every other day (24-hour fast) 11.  Stress reduction, good night sleep, relaxation, meditation, yoga and other physical activity is likely to help you to maintain low weight too. 12.  If you drink alcohol, limit your alcohol intake to no more than 2 drinks a day.   Mediterranean diet is good for  you. ([employer] has a typical Mediterranean cuisine menu) The Mediterranean diet is a way of eating based on the traditional cuisine of countries bordering the [place] Sea. While there is no single definition of the Mediterranean diet, it is typically high in vegetables, fruits, whole grains, beans, nut and seeds, and olive oil. The main components of Mediterranean diet include: . Daily consumption of vegetables, fruits, whole grains and healthy fats  . Weekly intake of fish, poultry, beans and eggs  . Moderate portions of dairy products  . Limited intake of red meat Other important elements of the Mediterranean diet are sharing meals with family and friends, enjoying a glass of red wine and being physically active. Health benefits of a Mediterranean diet: A traditional Mediterranean diet consisting of large quantities of fresh fruits and vegetables, nuts, fish and olive oil-coupled with physical activity-can reduce your risk of serious mental and physical health problems by: Preventing heart disease and strokes. Following a Mediterranean diet limits your intake of refined breads, processed foods, and red meat, and encourages drinking red wine instead of hard liquor-all factors that can help prevent heart disease and stroke. Keeping you agile. If you're an older adult, the nutrients gained with a Mediterranean diet may reduce your risk of developing muscle weakness and other signs of frailty by about 70 percent. Reducing the risk of Alzheimer's. Research suggests that the Mediterranean diet may improve cholesterol, blood sugar levels, and overall blood vessel health, which in turn may reduce your risk of Alzheimer's disease or dementia. Halving the   risk of Parkinson's disease. The high levels of antioxidants in the Mediterranean diet can prevent cells from undergoing a damaging process called oxidative stress, thereby cutting the risk of Parkinson's disease in half. Increasing longevity. By  reducing your risk of developing heart disease or cancer with the Mediterranean diet, you're reducing your risk of death at any age by 20%. Protecting against type 2 diabetes. A Mediterranean diet is rich in fiber which digests slowly, prevents huge swings in blood sugar, and can help you maintain a healthy weight.    Cabbage soup recipe that will not make you gain weight: Take 1 small head of cabbage, 1 average pack of celery, 4 green peppers, 4 onions, 2 cans diced tomatoes (they are not available without salt), salt and spices to taste.  Chop cabbage, celery, peppers and onions.  And tomatoes and 2-2.5 liters (2.5 quarts) of water so that it would just cover the vegetables.  Bring to boil.  Add spices and salt.  Turn heat to low/medium and simmer for 20-25 minutes.  Naturally, you can make a smaller batch and change some of the ingredients.  

## 2019-04-07 NOTE — Progress Notes (Signed)
Subjective:  Patient ID: John Rowe, male    DOB: 11-01-1945  Age: 73 y.o. MRN: [id number]  CC: No chief complaint on file.   HPI John Rowe presents for melanoma x3 - nose, L shoulder, LLE F/u HTN, dyslipidemia  Outpatient Medications Prior to Visit  Medication Sig Dispense Refill  . aspirin EC 81 MG tablet Take 81 mg by mouth daily.    . benazepril (LOTENSIN) 20 MG tablet Take 0.5 tablets (10 mg total) by mouth daily. 135 tablet 3  . cholecalciferol (VITAMIN D3) 25 MCG (1000 UT) tablet Take 1 tablet (1,000 Units total) by mouth daily. 30 tablet 11  . Cyanocobalamin (VITAMIN B-12) 1000 MCG SUBL Place 1 tablet (1,000 mcg total) under the tongue daily. 100 tablet 3  . metroNIDAZOLE (METROGEL) 1 % gel Apply topically daily. 60 g 3  . pravastatin (PRAVACHOL) 20 MG tablet TAKE 1 TABLET BY MOUTH EVERY DAY 30 tablet 11   Facility-Administered Medications Prior to Visit  Medication Dose Route Frequency Provider Last Rate Last Dose  . 0.9 %  sodium chloride infusion  500 mL Intravenous Once Milus Banister, MD        ROS: Review of Systems  Constitutional: Negative for appetite change, fatigue and unexpected weight change.  HENT: Negative for congestion, nosebleeds, sneezing, sore throat and trouble swallowing.   Eyes: Negative for itching and visual disturbance.  Respiratory: Negative for cough.   Cardiovascular: Negative for chest pain, palpitations and leg swelling.  Gastrointestinal: Negative for abdominal distention, blood in stool, diarrhea and nausea.  Genitourinary: Negative for frequency and hematuria.  Musculoskeletal: Negative for back pain, gait problem, joint swelling and neck pain.  Skin: Positive for color change. Negative for rash.  Neurological: Negative for dizziness, tremors, speech difficulty and weakness.  Psychiatric/Behavioral: Negative for agitation, dysphoric mood, sleep disturbance and suicidal ideas. The patient is not nervous/anxious.      Objective:  BP 110/68 (BP Location: Left Arm, Patient Position: Sitting, Cuff Size: Large)   Pulse 70   Temp (!) 97.4 F (36.3 C) (Oral)   Ht 5\' 8"  (1.727 m)   Wt 198 lb (89.8 kg)   SpO2 98%   BMI 30.11 kg/m   BP Readings from Last 3 Encounters:  04/07/19 110/68  10/07/18 118/78  04/02/18 126/82    Wt Readings from Last 3 Encounters:  04/07/19 198 lb (89.8 kg)  10/07/18 194 lb (88 kg)  04/02/18 196 lb (88.9 kg)    Physical Exam Constitutional:      General: He is not in acute distress.    Appearance: He is well-developed.     Comments: NAD  Eyes:     Conjunctiva/sclera: Conjunctivae normal.     Pupils: Pupils are equal, round, and reactive to light.  Neck:     Musculoskeletal: Normal range of motion.     Thyroid: No thyromegaly.     Vascular: No JVD.  Cardiovascular:     Rate and Rhythm: Normal rate and regular rhythm.     Heart sounds: Normal heart sounds. No murmur. No friction rub. No gallop.   Pulmonary:     Effort: Pulmonary effort is normal. No respiratory distress.     Breath sounds: Normal breath sounds. No wheezing or rales.  Chest:     Chest wall: No tenderness.  Abdominal:     General: Bowel sounds are normal. There is no distension.     Palpations: Abdomen is soft. There is no mass.  Tenderness: There is no abdominal tenderness. There is no guarding or rebound.  Musculoskeletal: Normal range of motion.        General: No tenderness.  Lymphadenopathy:     Cervical: No cervical adenopathy.  Skin:    General: Skin is warm and dry.     Findings: No rash.  Neurological:     Mental Status: He is alert and oriented to person, place, and time.     Cranial Nerves: No cranial nerve deficit.     Motor: No abnormal muscle tone.     Coordination: Coordination normal.     Gait: Gait normal.     Deep Tendon Reflexes: Reflexes are normal and symmetric.  Psychiatric:        Behavior: Behavior normal.        Thought Content: Thought content normal.         Judgment: Judgment normal.    Skin surgery scars   Lab Results  Component Value Date   WBC 10.7 (H) 10/07/2018   HGB 14.6 10/07/2018   HCT 42.8 10/07/2018   PLT 218.0 10/07/2018   GLUCOSE 104 (H) 10/07/2018   CHOL 185 10/07/2018   TRIG 84.0 10/07/2018   HDL 60.30 10/07/2018   LDLDIRECT 145.8 07/01/2010   LDLCALC 108 (H) 10/07/2018   ALT 17 10/07/2018   AST 20 10/07/2018   NA 136 10/07/2018   K 4.1 10/07/2018   CL 101 10/07/2018   CREATININE 1.00 10/07/2018   BUN 14 10/07/2018   CO2 25 10/07/2018   TSH 1.17 10/07/2018   PSA 0.25 10/07/2018   INR 1.0 RATIO 06/26/2006   HGBA1C 5.6 10/07/2018    No results found.  Assessment & Plan:   There are no diagnoses linked to this encounter.   No orders of the defined types were placed in this encounter.    Follow-up: No follow-ups on file.  Walker Kehr, MD

## 2019-04-07 NOTE — Assessment & Plan Note (Addendum)
On B12 po Labs

## 2019-04-07 NOTE — Assessment & Plan Note (Signed)
Labs

## 2019-04-07 NOTE — Assessment & Plan Note (Signed)
melanoma x3 - nose, L shoulder, LLE

## 2019-04-07 NOTE — Assessment & Plan Note (Signed)
cardiac CT scan for calcium scoring - will sch

## 2019-05-01 ENCOUNTER — Other Ambulatory Visit: Payer: Self-pay | Admitting: Internal Medicine

## 2019-05-16 ENCOUNTER — Other Ambulatory Visit: Payer: Self-pay

## 2019-05-16 ENCOUNTER — Ambulatory Visit (INDEPENDENT_AMBULATORY_CARE_PROVIDER_SITE_OTHER)
Admission: RE | Admit: 2019-05-16 | Discharge: 2019-05-16 | Disposition: A | Discharge status: Home / Self Care | Payer: Self-pay | Source: Ambulatory Visit | Attending: Internal Medicine | Admitting: Internal Medicine

## 2019-05-16 DIAGNOSIS — E785 Hyperlipidemia, unspecified: Secondary | ICD-10-CM

## 2019-10-04 ENCOUNTER — Other Ambulatory Visit: Payer: Self-pay | Admitting: Internal Medicine

## 2019-10-08 ENCOUNTER — Ambulatory Visit (INDEPENDENT_AMBULATORY_CARE_PROVIDER_SITE_OTHER): Payer: Medicare Other | Admitting: Internal Medicine

## 2019-10-08 ENCOUNTER — Encounter: Payer: Self-pay | Admitting: Internal Medicine

## 2019-10-08 ENCOUNTER — Other Ambulatory Visit: Payer: Self-pay

## 2019-10-08 VITALS — BP 116/72 | HR 72 | Temp 98.1°F | Ht 68.0 in | Wt 198.0 lb

## 2019-10-08 DIAGNOSIS — I10 Essential (primary) hypertension: Secondary | ICD-10-CM

## 2019-10-08 DIAGNOSIS — C44300 Unspecified malignant neoplasm of skin of unspecified part of face: Secondary | ICD-10-CM

## 2019-10-08 DIAGNOSIS — R739 Hyperglycemia, unspecified: Secondary | ICD-10-CM

## 2019-10-08 DIAGNOSIS — Z0000 Encounter for general adult medical examination without abnormal findings: Secondary | ICD-10-CM

## 2019-10-08 DIAGNOSIS — E538 Deficiency of other specified B group vitamins: Secondary | ICD-10-CM | POA: Diagnosis not present

## 2019-10-08 DIAGNOSIS — E559 Vitamin D deficiency, unspecified: Secondary | ICD-10-CM

## 2019-10-08 DIAGNOSIS — N320 Bladder-neck obstruction: Secondary | ICD-10-CM | POA: Diagnosis not present

## 2019-10-08 LAB — BASIC METABOLIC PANEL
BUN: 15 mg/dL (ref 6–23)
CO2: 26 mEq/L (ref 19–32)
Calcium: 9.2 mg/dL (ref 8.4–10.5)
Chloride: 102 mEq/L (ref 96–112)
Creatinine, Ser: 1.11 mg/dL (ref 0.40–1.50)
GFR: 64.4 mL/min (ref >60.00)
Glucose, Bld: 135 mg/dL — ABNORMAL HIGH (ref 70–99)
Potassium: 4.1 mEq/L (ref 3.5–5.1)
Sodium: 136 mEq/L (ref 135–145)

## 2019-10-08 LAB — HEPATIC FUNCTION PANEL
ALT: 21 U/L (ref 0–53)
AST: 20 U/L (ref 0–37)
Albumin: 4.1 g/dL (ref 3.5–5.2)
Alkaline Phosphatase: 60 U/L (ref 39–117)
Bilirubin, Direct: 0.2 mg/dL (ref 0.0–0.3)
Total Bilirubin: 0.8 mg/dL (ref 0.2–1.2)
Total Protein: 6.4 g/dL (ref 6.0–8.3)

## 2019-10-08 LAB — LIPID PANEL
Cholesterol: 181 mg/dL (ref 0–200)
HDL: 55.4 mg/dL (ref >39.00)
LDL Cholesterol: 108 mg/dL — ABNORMAL HIGH (ref 0–99)
NonHDL: 125.64
Total CHOL/HDL Ratio: 3
Triglycerides: 88 mg/dL (ref 0.0–149.0)
VLDL: 17.6 mg/dL (ref 0.0–40.0)

## 2019-10-08 LAB — VITAMIN B12: Vitamin B-12: 798 pg/mL (ref 211–911)

## 2019-10-08 LAB — HEMOGLOBIN A1C: Hgb A1c MFr Bld: 5.7 % (ref 4.6–6.5)

## 2019-10-08 LAB — VITAMIN D 25 HYDROXY (VIT D DEFICIENCY, FRACTURES): VITD: 37.11 ng/mL (ref 30.00–100.00)

## 2019-10-08 LAB — PSA: PSA: 0.25 ng/mL (ref 0.10–4.00)

## 2019-10-08 NOTE — Assessment & Plan Note (Signed)
A1c

## 2019-10-08 NOTE — Assessment & Plan Note (Addendum)
We discussed age appropriate health related issues, including available/recomended screening tests and vaccinations. We discussed a need for adhering to healthy diet and exercise. Labs were ordered to be later reviewed . All questions were answered. Cologuard (-) 2019

## 2019-10-08 NOTE — Patient Instructions (Signed)
We are committed to keeping you informed about the COVID-19 vaccine.  As the vaccine continues to become available for each phase, we will ensure that patients who meet the criteria receive the information they need to access vaccination opportunities. Continue to check your MyChart account and [email or web address] for updates. Please review the Phase 1b information below.  [facility] - [place] On Tuesday, Jan. 19, the [place] [facility] ([facility]) and [place] began large-scale COVID-19 vaccinations at the [facility]. The vaccinations are appointment only and for those 65 and older.  Walk-ins will not be accepted.  All appointments are currently filled. Please join our waiting list for the next available appointments. We will contact you when appointments become available. Please do not sign up more than once.  Join Our Waiting List  Our daily vaccination capacity will continue to increase, including expansion of our [facility] [place] [facility] site, increasing mobile clinics across our service region and plans to open COVID-19 vaccination clinics in [place] and [place] counties in February.  On Friday, Jan. 22, we announced the need to reschedule 10,400 vaccination appointments because of the state's decision not to allocate [place] with COVID-19 vaccines for the week of Jan. 25. Our announcement of this news is available here. We are communicating directly with those who need their appointments rescheduled and with those on our waitlist. We will inform these groups immediately when state vaccine allocations enable us to schedule their vaccination appointments. We thank everyone in our community for their patience and understanding as we work with the state to ensure adequate vaccine allocations in weeks ahead. Going forward, we will schedule vaccination appointments weekly based on vaccine allocations  provided by the state.  Other Vaccination Opportunities in Our Area We are also working in partnership with county health agencies in our service counties to ensure continuing vaccination availability in the weeks and months ahead. Learn more about each county's vaccination efforts in the website links below:   [place]  [place]  [place]  [place]  [place]   [place] phase 1b vaccination guidelines, prioritizing those 65 and over as the next eligible group to receive the COVID-19 vaccine, are detailed at YourSpotYour[employer]ot.[place].gov.   Vaccine Safety and Effectiveness Clinical trials for the Pfizer COVID-19 vaccine involved 42,000 people and showed that the vaccine is more than 95% effective in preventing COVID-19 with no serious safety concerns. Similar results have been reported for the Moderna COVID-19 vaccine. Side effects reported in the Pfizer clinical trials include a sore arm at the injection site, fatigue, headache, chills and fever. While side effects from the Pfizer COVID-19 vaccine are higher than for a typical flu vaccine, they are lower in many ways than side effects from the leading vaccine to prevent shingles. Side effects are signs that a vaccine is working and are related to your immune system being stimulated to produce antibodies against infection. Side effects from vaccination are far less significant than health impacts from COVID-19.  Staying Informed Pharmacists, infectious disease doctors, critical care nurses and other experts at [facility] continue to speak publicly through media interviews and direct communication with our patients and communities about the safety, effectiveness and importance of vaccines to eliminate COVID-19. In addition, reliable information on vaccine safety, effectiveness, side effects and more is available on the following websites:  [place] Department of Health and Human Services COVID-19 Vaccine Information Website.  U.S. Centers  for Disease Control and Prevention COVID-19 Vaccine Public Information   Website.  Staying Safe We agree with the CDC on what we can do to help our communities get back to normal: Getting "back to normal" is going to take all of our tools. If we use all the tools we have, we stand the best chance of getting our families, communities, schools and workplaces "back to normal" sooner:  Get vaccinated as soon as vaccines become available within the phase of the state's vaccination rollout plan for which you meet the eligibility criteria.  Wear a mask.  Stay 6 feet from others and avoid crowds.  Wash hands often.  For our most current information, please visit [email or web address].  

## 2019-10-08 NOTE — Assessment & Plan Note (Signed)
Lotensin

## 2019-10-08 NOTE — Progress Notes (Signed)
Subjective:  Patient ID: John Rowe, male    DOB: Apr 21, 1946  Age: 74 y.o. MRN: [id number]  CC: No chief complaint on file.   HPI John Rowe presents for HTN, B12 def, skin cancer - nose Well exam   Outpatient Medications Prior to Visit  Medication Sig Dispense Refill  . aspirin EC 81 MG tablet Take 81 mg by mouth daily.    . benazepril (LOTENSIN) 20 MG tablet TAKE ONE-HALF TABLET BY MOUTH ONCE DAILY 45 tablet 3  . cholecalciferol (VITAMIN D) 25 MCG (1000 UNIT) tablet TAKE 1 TABLET BY MOUTH EVERY DAY 30 tablet 11  . Cyanocobalamin (VITAMIN B-12) 1000 MCG SUBL Place 1 tablet (1,000 mcg total) under the tongue daily. 100 tablet 3  . metroNIDAZOLE (METROGEL) 1 % gel Apply topically daily. 60 g 3  . pravastatin (PRAVACHOL) 20 MG tablet TAKE 1 TABLET BY MOUTH EVERY DAY 30 tablet 11   Facility-Administered Medications Prior to Visit  Medication Dose Route Frequency Provider Last Rate Last Admin  . 0.9 %  sodium chloride infusion  500 mL Intravenous Once Milus Banister, MD        ROS: Review of Systems  Constitutional: Negative for appetite change, fatigue and unexpected weight change.  HENT: Negative for congestion, nosebleeds, sneezing, sore throat and trouble swallowing.   Eyes: Negative for itching and visual disturbance.  Respiratory: Negative for cough.   Cardiovascular: Negative for chest pain, palpitations and leg swelling.  Gastrointestinal: Negative for abdominal distention, blood in stool, diarrhea and nausea.  Genitourinary: Negative for frequency and hematuria.  Musculoskeletal: Positive for arthralgias. Negative for back pain, gait problem, joint swelling and neck pain.  Skin: Negative for rash.  Neurological: Negative for dizziness, tremors, speech difficulty and weakness.  Psychiatric/Behavioral: Negative for agitation, dysphoric mood, sleep disturbance and suicidal ideas. The patient is not nervous/anxious.     Objective:  BP 116/72 (BP Location: Left  Arm, Patient Position: Sitting, Cuff Size: Large)   Pulse 72   Temp 98.1 F (36.7 C) (Oral)   Ht 5\' 8"  (1.727 m)   Wt 198 lb (89.8 kg)   SpO2 97%   BMI 30.11 kg/m   BP Readings from Last 3 Encounters:  10/08/19 116/72  04/07/19 110/68  10/07/18 118/78    Wt Readings from Last 3 Encounters:  10/08/19 198 lb (89.8 kg)  04/07/19 198 lb (89.8 kg)  10/07/18 194 lb (88 kg)    Physical Exam Constitutional:      General: He is not in acute distress.    Appearance: He is well-developed.     Comments: NAD  Eyes:     Conjunctiva/sclera: Conjunctivae normal.     Pupils: Pupils are equal, round, and reactive to light.  Neck:     Thyroid: No thyromegaly.     Vascular: No JVD.  Cardiovascular:     Rate and Rhythm: Normal rate and regular rhythm.     Heart sounds: Normal heart sounds. No murmur. No friction rub. No gallop.   Pulmonary:     Effort: Pulmonary effort is normal. No respiratory distress.     Breath sounds: Normal breath sounds. No wheezing or rales.  Chest:     Chest wall: No tenderness.  Abdominal:     General: Bowel sounds are normal. There is no distension.     Palpations: Abdomen is soft. There is no mass.     Tenderness: There is no abdominal tenderness. There is no guarding or rebound.  Musculoskeletal:  General: No tenderness. Normal range of motion.     Cervical back: Normal range of motion.  Lymphadenopathy:     Cervical: No cervical adenopathy.  Skin:    General: Skin is warm and dry.     Findings: No rash.  Neurological:     Mental Status: He is alert and oriented to person, place, and time.     Cranial Nerves: No cranial nerve deficit.     Motor: No abnormal muscle tone.     Coordination: Coordination normal.     Gait: Gait normal.     Deep Tendon Reflexes: Reflexes are normal and symmetric.  Psychiatric:        Behavior: Behavior normal.        Thought Content: Thought content normal.        Judgment: Judgment normal.    Nose  scar/flap  Lab Results  Component Value Date   WBC 10.7 (H) 10/07/2018   HGB 14.6 10/07/2018   HCT 42.8 10/07/2018   PLT 218.0 10/07/2018   GLUCOSE 134 (H) 04/07/2019   CHOL 185 10/07/2018   TRIG 84.0 10/07/2018   HDL 60.30 10/07/2018   LDLDIRECT 145.8 07/01/2010   LDLCALC 108 (H) 10/07/2018   ALT 17 10/07/2018   AST 20 10/07/2018   NA 136 04/07/2019   K 4.0 04/07/2019   CL 104 04/07/2019   CREATININE 1.07 04/07/2019   BUN 17 04/07/2019   CO2 24 04/07/2019   TSH 1.17 10/07/2018   PSA 0.25 10/07/2018   INR 1.0 RATIO 06/26/2006   HGBA1C 5.6 10/07/2018    CT CARDIAC SCORING  Addendum Date: 05/16/2019   ADDENDUM REPORT: 05/16/2019 13:20 EXAM: OVER-READ INTERPRETATION  CT CHEST The following report is an over-read performed by radiologist Dr. Rosine Beat [facility] on 05/16/2019. This over-read does not include interpretation of cardiac or coronary anatomy or pathology. The cardiac CT a interpretation by the cardiologist is attached. COMPARISON:  CT chest 12/26/2016 FINDINGS: Visualized portion of the central chest shows no mediastinal or hilar lymphadenopathy. Visualized portion of the mid and distal esophagus is unremarkable. Ascending thoracic aorta measures up to 3.9 cm diameter. Atherosclerotic calcification noted descending thoracic aorta. Imaged lung parenchyma shows no worrisome pulmonary nodule or mass. No focal airspace consolidation. No pleural effusion. No worrisome lytic or sclerotic osseous abnormality. Old posterior left rib fractures noted. IMPRESSION: No acute findings within the extra cardiac anatomy of the visualized thorax. Aortic Atherosclerois (ICD10-170.0) Electronically Signed   By: Misty Stanley M.D.   On: 05/16/2019 13:20   Result Date: 05/16/2019 CLINICAL DATA:  Risk stratification EXAM: Coronary Calcium Score TECHNIQUE: The patient was scanned on a [employer]. Axial non-contrast 3 mm slices were carried out through the heart. The data set  was analyzed on a dedicated work station and scored using the [place]. FINDINGS: Non-cardiac: See separate report from [facility]. Ascending Aorta: 39 mm, normal size. Descending Aorta: 27 mm, normal size with mild diffuse calcifications. Pericardium: Normal Coronary arteries: Normal origin. IMPRESSION: Coronary calcium score of 10 . This was 12 percentile for age and sex matched control. Candee Furbish, MD Electronically Signed: By: Candee Furbish MD On: 05/16/2019 11:27    Assessment & Plan:   There are no diagnoses linked to this encounter.   No orders of the defined types were placed in this encounter.    Follow-up: No follow-ups on file.  Walker Kehr, MD

## 2019-10-08 NOTE — Assessment & Plan Note (Signed)
On B12 

## 2019-10-08 NOTE — Assessment & Plan Note (Signed)
Nose was reconstructed

## 2019-10-27 ENCOUNTER — Ambulatory Visit: Payer: Medicare Other | Attending: Internal Medicine

## 2019-10-27 DIAGNOSIS — Z20822 Contact with and (suspected) exposure to covid-19: Secondary | ICD-10-CM

## 2019-10-28 LAB — NOVEL CORONAVIRUS, NAA: SARS-CoV-2, NAA: NOT DETECTED

## 2019-10-30 ENCOUNTER — Encounter: Payer: Self-pay | Admitting: Internal Medicine

## 2019-11-09 ENCOUNTER — Ambulatory Visit: Payer: Medicare Other

## 2019-11-09 ENCOUNTER — Other Ambulatory Visit: Payer: Self-pay

## 2019-11-09 ENCOUNTER — Ambulatory Visit: Payer: Medicare Other | Attending: Internal Medicine

## 2019-11-09 DIAGNOSIS — Z23 Encounter for immunization: Secondary | ICD-10-CM | POA: Insufficient documentation

## 2019-11-09 NOTE — Progress Notes (Signed)
   Covid-19 Vaccination Clinic  Name:  John Rowe    MRN: [id number] DOB: 1946/01/02  11/09/2019  [name] was observed post Covid-19 immunization for 15 minutes without incidence. He was provided with Vaccine Information Sheet and instruction to access the V-Safe system.   [name] was instructed to call 911 with any severe reactions post vaccine: Marland Kitchen Difficulty breathing  . Swelling of your face and throat  . A fast heartbeat  . A bad rash all over your body  . Dizziness and weakness    Immunizations Administered    Name Date Dose VIS Date Route   Pfizer COVID-19 Vaccine 11/09/2019  4:21 PM 0.3 mL 08/22/2019 Intramuscular   Manufacturer: [place]   Lot: [id number]   [place]: [id number]

## 2019-12-09 ENCOUNTER — Ambulatory Visit: Payer: Medicare Other | Attending: Internal Medicine

## 2019-12-09 DIAGNOSIS — Z23 Encounter for immunization: Secondary | ICD-10-CM

## 2019-12-09 NOTE — Progress Notes (Signed)
   Covid-19 Vaccination Clinic  Name:  HEIDI VICENCIO    MRN: [id number] DOB: 1946-04-16  12/09/2019  Mr. Newmeyer was observed post Covid-19 immunization for 15 minutes without incident. He was provided with Vaccine Information Sheet and instruction to access the V-Safe system.   Mr. Saltarelli was instructed to call 911 with any severe reactions post vaccine: Marland Kitchen Difficulty breathing  . Swelling of face and throat  . A fast heartbeat  . A bad rash all over body  . Dizziness and weakness   Immunizations Administered    Name Date Dose VIS Date Route   Pfizer COVID-19 Vaccine 12/09/2019 10:46 AM 0.3 mL 08/22/2019 Intramuscular   Manufacturer: [place]   Lot: [id number]   [place]: [id number]

## 2019-12-29 ENCOUNTER — Other Ambulatory Visit: Payer: Self-pay | Admitting: Internal Medicine

## 2020-04-06 ENCOUNTER — Ambulatory Visit (INDEPENDENT_AMBULATORY_CARE_PROVIDER_SITE_OTHER): Payer: Medicare Other | Admitting: Internal Medicine

## 2020-04-06 ENCOUNTER — Encounter: Payer: Self-pay | Admitting: Internal Medicine

## 2020-04-06 ENCOUNTER — Other Ambulatory Visit: Payer: Self-pay

## 2020-04-06 VITALS — BP 116/68 | HR 84 | Temp 98.0°F | Ht 68.0 in | Wt 199.0 lb

## 2020-04-06 DIAGNOSIS — Z0000 Encounter for general adult medical examination without abnormal findings: Secondary | ICD-10-CM

## 2020-04-06 DIAGNOSIS — E538 Deficiency of other specified B group vitamins: Secondary | ICD-10-CM | POA: Diagnosis not present

## 2020-04-06 DIAGNOSIS — E785 Hyperlipidemia, unspecified: Secondary | ICD-10-CM

## 2020-04-06 DIAGNOSIS — I10 Essential (primary) hypertension: Secondary | ICD-10-CM

## 2020-04-06 MED ORDER — BENAZEPRIL HCL 20 MG PO TABS: 10.0000 mg | ORAL_TABLET | Freq: Every day | ORAL | 11 refills | Status: DC

## 2020-04-06 MED ORDER — VITAMIN D3 25 MCG (1000 UNIT) PO TABS: 1000.0000 [IU] | ORAL_TABLET | Freq: Every day | ORAL | 11 refills | Status: DC

## 2020-04-06 MED ORDER — PRAVASTATIN SODIUM 20 MG PO TABS: 20.0000 mg | ORAL_TABLET | Freq: Every day | ORAL | 11 refills | Status: DC

## 2020-04-06 NOTE — Assessment & Plan Note (Signed)
On Lotensin

## 2020-04-06 NOTE — Patient Instructions (Signed)
[place] Conversation Enhancing Headphones  

## 2020-04-06 NOTE — Assessment & Plan Note (Signed)
Pravachol

## 2020-04-06 NOTE — Assessment & Plan Note (Signed)
On B12 

## 2020-04-06 NOTE — Progress Notes (Signed)
Subjective:  Patient ID: John Rowe, male    DOB: 10-05-1945  Age: 74 y.o. MRN: [id number]  CC: No chief complaint on file.   HPI John Rowe presents for B12 def, HTN, dyslipidemia f/u C/o hearing loss L  Outpatient Medications Prior to Visit  Medication Sig Dispense Refill  . aspirin EC 81 MG tablet Take 81 mg by mouth daily.    . benazepril (LOTENSIN) 20 MG tablet TAKE 1/2 TABLET BY MOUTH ONCE DAILY 45 tablet 3  . cholecalciferol (VITAMIN D) 25 MCG (1000 UNIT) tablet TAKE 1 TABLET BY MOUTH EVERY DAY 30 tablet 11  . Cyanocobalamin (VITAMIN B-12) 1000 MCG SUBL Place 1 tablet (1,000 mcg total) under the tongue daily. 100 tablet 3  . metroNIDAZOLE (METROGEL) 1 % gel Apply topically daily. 60 g 3  . pravastatin (PRAVACHOL) 20 MG tablet TAKE 1 TABLET BY MOUTH EVERY DAY 90 tablet 3   Facility-Administered Medications Prior to Visit  Medication Dose Route Frequency Provider Last Rate Last Admin  . 0.9 %  sodium chloride infusion  500 mL Intravenous Once Milus Banister, MD        ROS: Review of Systems  Constitutional: Negative for appetite change, fatigue and unexpected weight change.  HENT: Positive for hearing loss. Negative for congestion, nosebleeds, sneezing, sore throat and trouble swallowing.   Eyes: Negative for itching and visual disturbance.  Respiratory: Negative for cough.   Cardiovascular: Negative for chest pain, palpitations and leg swelling.  Gastrointestinal: Negative for abdominal distention, blood in stool, diarrhea and nausea.  Genitourinary: Negative for frequency and hematuria.  Musculoskeletal: Negative for back pain, gait problem, joint swelling and neck pain.  Skin: Negative for rash.  Neurological: Negative for dizziness, tremors, speech difficulty and weakness.  Psychiatric/Behavioral: Negative for agitation, dysphoric mood and sleep disturbance. The patient is not nervous/anxious.     Objective:  BP 116/68 (BP Location: Right Arm, Patient  Position: Sitting, Cuff Size: Large)   Pulse 84   Temp 98 F (36.7 C) (Oral)   Ht 5\' 8"  (1.727 m)   Wt 199 lb (90.3 kg)   SpO2 98%   BMI 30.26 kg/m   BP Readings from Last 3 Encounters:  04/06/20 116/68  10/08/19 116/72  04/07/19 110/68    Wt Readings from Last 3 Encounters:  04/06/20 199 lb (90.3 kg)  10/08/19 198 lb (89.8 kg)  04/07/19 198 lb (89.8 kg)    Physical Exam Constitutional:      General: He is not in acute distress.    Appearance: He is well-developed.     Comments: NAD  Eyes:     Conjunctiva/sclera: Conjunctivae normal.     Pupils: Pupils are equal, round, and reactive to light.  Neck:     Thyroid: No thyromegaly.     Vascular: No JVD.  Cardiovascular:     Rate and Rhythm: Normal rate and regular rhythm.     Heart sounds: Normal heart sounds. No murmur heard.  No friction rub. No gallop.   Pulmonary:     Effort: Pulmonary effort is normal. No respiratory distress.     Breath sounds: Normal breath sounds. No wheezing or rales.  Chest:     Chest wall: No tenderness.  Abdominal:     General: Bowel sounds are normal. There is no distension.     Palpations: Abdomen is soft. There is no mass.     Tenderness: There is no abdominal tenderness. There is no guarding or rebound.  Musculoskeletal:  General: No tenderness. Normal range of motion.     Cervical back: Normal range of motion.  Lymphadenopathy:     Cervical: No cervical adenopathy.  Skin:    General: Skin is warm and dry.     Findings: No rash.  Neurological:     Mental Status: He is alert and oriented to person, place, and time.     Cranial Nerves: No cranial nerve deficit.     Motor: No abnormal muscle tone.     Coordination: Coordination normal.     Gait: Gait normal.     Deep Tendon Reflexes: Reflexes are normal and symmetric.  Psychiatric:        Behavior: Behavior normal.        Thought Content: Thought content normal.        Judgment: Judgment normal.   no wax impaction  B  Lab Results  Component Value Date   WBC 10.7 (H) 10/07/2018   HGB 14.6 10/07/2018   HCT 42.8 10/07/2018   PLT 218.0 10/07/2018   GLUCOSE 135 (H) 10/08/2019   CHOL 181 10/08/2019   TRIG 88.0 10/08/2019   HDL 55.40 10/08/2019   LDLDIRECT 145.8 07/01/2010   LDLCALC 108 (H) 10/08/2019   ALT 21 10/08/2019   AST 20 10/08/2019   NA 136 10/08/2019   K 4.1 10/08/2019   CL 102 10/08/2019   CREATININE 1.11 10/08/2019   BUN 15 10/08/2019   CO2 26 10/08/2019   TSH 1.17 10/07/2018   PSA 0.25 10/08/2019   INR 1.0 RATIO 06/26/2006   HGBA1C 5.7 10/08/2019    CT CARDIAC SCORING  Addendum Date: 05/16/2019   ADDENDUM REPORT: 05/16/2019 13:20 EXAM: OVER-READ INTERPRETATION  CT CHEST The following report is an over-read performed by radiologist Dr. Rosine Beat [facility] on 05/16/2019. This over-read does not include interpretation of cardiac or coronary anatomy or pathology. The cardiac CT a interpretation by the cardiologist is attached. COMPARISON:  CT chest 12/26/2016 FINDINGS: Visualized portion of the central chest shows no mediastinal or hilar lymphadenopathy. Visualized portion of the mid and distal esophagus is unremarkable. Ascending thoracic aorta measures up to 3.9 cm diameter. Atherosclerotic calcification noted descending thoracic aorta. Imaged lung parenchyma shows no worrisome pulmonary nodule or mass. No focal airspace consolidation. No pleural effusion. No worrisome lytic or sclerotic osseous abnormality. Old posterior left rib fractures noted. IMPRESSION: No acute findings within the extra cardiac anatomy of the visualized thorax. Aortic Atherosclerois (ICD10-170.0) Electronically Signed   By: Misty Stanley M.D.   On: 05/16/2019 13:20   Result Date: 05/16/2019 CLINICAL DATA:  Risk stratification EXAM: Coronary Calcium Score TECHNIQUE: The patient was scanned on a [employer]. Axial non-contrast 3 mm slices were carried out through the heart. The data set was  analyzed on a dedicated work station and scored using the [place]. FINDINGS: Non-cardiac: See separate report from [facility]. Ascending Aorta: 39 mm, normal size. Descending Aorta: 27 mm, normal size with mild diffuse calcifications. Pericardium: Normal Coronary arteries: Normal origin. IMPRESSION: Coronary calcium score of 10 . This was 12 percentile for age and sex matched control. Candee Furbish, MD Electronically Signed: By: Candee Furbish MD On: 05/16/2019 11:27    Assessment & Plan:   There are no diagnoses linked to this encounter.   No orders of the defined types were placed in this encounter.    Follow-up: No follow-ups on file.  Walker Kehr, MD

## 2020-05-26 ENCOUNTER — Other Ambulatory Visit: Payer: Medicare Other

## 2020-05-26 ENCOUNTER — Other Ambulatory Visit: Payer: Self-pay

## 2020-05-26 DIAGNOSIS — Z20822 Contact with and (suspected) exposure to covid-19: Secondary | ICD-10-CM

## 2020-05-28 LAB — NOVEL CORONAVIRUS, NAA: SARS-CoV-2, NAA: NOT DETECTED

## 2020-05-28 LAB — SARS-COV-2, NAA 2 DAY TAT

## 2020-07-25 ENCOUNTER — Ambulatory Visit: Payer: Medicare Other | Attending: Internal Medicine

## 2020-07-25 DIAGNOSIS — Z23 Encounter for immunization: Secondary | ICD-10-CM

## 2020-07-25 NOTE — Progress Notes (Signed)
   Covid-19 Vaccination Clinic  Name:  LIBAN GUEDES    MRN: [id number] DOB: 1945/09/18  07/25/2020  Mr. Hobby was observed post Covid-19 immunization for 15 minutes without incident. He was provided with Vaccine Information Sheet and instruction to access the V-Safe system.   Mr. Trompeter was instructed to call 911 with any severe reactions post vaccine: Marland Kitchen Difficulty breathing  . Swelling of face and throat  . A fast heartbeat  . A bad rash all over body  . Dizziness and weakness   Immunizations Administered    Name Date Dose VIS Date Route   Pfizer COVID-19 Vaccine 07/25/2020 11:07 AM 0.3 mL 06/30/2020 Intramuscular   Manufacturer: [place]   Lot: [id number]   [place]: [id number]

## 2020-10-06 ENCOUNTER — Other Ambulatory Visit: Payer: Self-pay

## 2020-10-07 ENCOUNTER — Ambulatory Visit (INDEPENDENT_AMBULATORY_CARE_PROVIDER_SITE_OTHER): Payer: Medicare Other

## 2020-10-07 ENCOUNTER — Ambulatory Visit (INDEPENDENT_AMBULATORY_CARE_PROVIDER_SITE_OTHER): Payer: Medicare Other | Admitting: Internal Medicine

## 2020-10-07 ENCOUNTER — Encounter: Payer: Self-pay | Admitting: Internal Medicine

## 2020-10-07 VITALS — BP 122/70 | HR 87 | Temp 97.7°F | Ht 68.0 in | Wt 197.3 lb

## 2020-10-07 VITALS — BP 122/70 | HR 87 | Temp 95.0°F | Ht 68.0 in | Wt 197.4 lb

## 2020-10-07 DIAGNOSIS — Z0000 Encounter for general adult medical examination without abnormal findings: Secondary | ICD-10-CM

## 2020-10-07 DIAGNOSIS — E538 Deficiency of other specified B group vitamins: Secondary | ICD-10-CM

## 2020-10-07 DIAGNOSIS — R739 Hyperglycemia, unspecified: Secondary | ICD-10-CM | POA: Diagnosis not present

## 2020-10-07 DIAGNOSIS — I2583 Coronary atherosclerosis due to lipid rich plaque: Secondary | ICD-10-CM

## 2020-10-07 DIAGNOSIS — E785 Hyperlipidemia, unspecified: Secondary | ICD-10-CM | POA: Diagnosis not present

## 2020-10-07 DIAGNOSIS — I10 Essential (primary) hypertension: Secondary | ICD-10-CM | POA: Diagnosis not present

## 2020-10-07 DIAGNOSIS — Z125 Encounter for screening for malignant neoplasm of prostate: Secondary | ICD-10-CM | POA: Diagnosis not present

## 2020-10-07 DIAGNOSIS — I2510 Atherosclerotic heart disease of native coronary artery without angina pectoris: Secondary | ICD-10-CM

## 2020-10-07 DIAGNOSIS — Z23 Encounter for immunization: Secondary | ICD-10-CM

## 2020-10-07 LAB — COMPREHENSIVE METABOLIC PANEL
ALT: 17 U/L (ref 0–53)
AST: 17 U/L (ref 0–37)
Albumin: 4.1 g/dL (ref 3.5–5.2)
Alkaline Phosphatase: 58 U/L (ref 39–117)
BUN: 15 mg/dL (ref 6–23)
CO2: 29 mEq/L (ref 19–32)
Calcium: 9.4 mg/dL (ref 8.4–10.5)
Chloride: 99 mEq/L (ref 96–112)
Creatinine, Ser: 1.19 mg/dL (ref 0.40–1.50)
GFR: 59.12 mL/min — ABNORMAL LOW (ref >60.00)
Glucose, Bld: 130 mg/dL — ABNORMAL HIGH (ref 70–99)
Potassium: 4.5 mEq/L (ref 3.5–5.1)
Sodium: 134 mEq/L — ABNORMAL LOW (ref 135–145)
Total Bilirubin: 0.8 mg/dL (ref 0.2–1.2)
Total Protein: 6.8 g/dL (ref 6.0–8.3)

## 2020-10-07 LAB — URINALYSIS
Bilirubin Urine: NEGATIVE
Hgb urine dipstick: NEGATIVE
Ketones, ur: NEGATIVE
Leukocytes,Ua: NEGATIVE
Nitrite: NEGATIVE
Specific Gravity, Urine: 1.01 (ref 1.000–1.030)
Total Protein, Urine: NEGATIVE
Urine Glucose: NEGATIVE
Urobilinogen, UA: 0.2 (ref 0.0–1.0)
pH: 6 (ref 5.0–8.0)

## 2020-10-07 LAB — LIPID PANEL
Cholesterol: 176 mg/dL (ref 0–200)
HDL: 53.9 mg/dL (ref >39.00)
LDL Cholesterol: 102 mg/dL — ABNORMAL HIGH (ref 0–99)
NonHDL: 122.34
Total CHOL/HDL Ratio: 3
Triglycerides: 103 mg/dL (ref 0.0–149.0)
VLDL: 20.6 mg/dL (ref 0.0–40.0)

## 2020-10-07 LAB — CBC WITH DIFFERENTIAL/PLATELET
Basophils Absolute: 0.1 10*3/uL (ref 0.0–0.1)
Basophils Relative: 0.8 % (ref 0.0–3.0)
Eosinophils Absolute: 0.2 10*3/uL (ref 0.0–0.7)
Eosinophils Relative: 2 % (ref 0.0–5.0)
HCT: 42.2 % (ref 39.0–52.0)
Hemoglobin: 14.5 g/dL (ref 13.0–17.0)
Lymphocytes Relative: 27.8 % (ref 12.0–46.0)
Lymphs Abs: 2.5 10*3/uL (ref 0.7–4.0)
MCHC: 34.4 g/dL (ref 30.0–36.0)
MCV: 95.6 fl (ref 78.0–100.0)
Monocytes Absolute: 0.8 10*3/uL (ref 0.1–1.0)
Monocytes Relative: 9.3 % (ref 3.0–12.0)
Neutro Abs: 5.4 10*3/uL (ref 1.4–7.7)
Neutrophils Relative %: 60.1 % (ref 43.0–77.0)
Platelets: 202 10*3/uL (ref 150.0–400.0)
RBC: 4.41 Mil/uL (ref 4.22–5.81)
RDW: 13.5 % (ref 11.5–15.5)
WBC: 8.9 10*3/uL (ref 4.0–10.5)

## 2020-10-07 LAB — PSA: PSA: 0.16 ng/mL (ref 0.10–4.00)

## 2020-10-07 LAB — TSH: TSH: 1 u[IU]/mL (ref 0.35–4.50)

## 2020-10-07 NOTE — Assessment & Plan Note (Addendum)
On Lotensin Continue with current therapy Coronary calcium CT score of 10

## 2020-10-07 NOTE — Addendum Note (Signed)
Addended by: Cresenciano Lick on: 10/07/2020 09:23 AM   Modules accepted: Orders

## 2020-10-07 NOTE — Patient Instructions (Signed)
[name] , Thank you for taking time to come for your Medicare Wellness Visit. I appreciate your ongoing commitment to your health goals. Please review the following plan we discussed and let me know if I can assist you in the future.   Screening recommendations/referrals: Colonoscopy: 03/28/2018; Cologuard due every 3 years Recommended yearly ophthalmology/optometry visit for glaucoma screening and checkup Recommended yearly dental visit for hygiene and checkup  Vaccinations: Influenza vaccine: 10/07/2020 Pneumococcal vaccine: up to date; need booster for Pneumovax 23 Tdap vaccine: 10/07/2018; due every 10 years Shingles vaccine: never done   Covid-19: up to date  Advanced directives: Please bring a copy of your health care power of attorney and living will to the office at your convenience.  Conditions/risks identified: Yes; Reviewed health maintenance screenings with patient today and relevant education, vaccines, and/or referrals were provided. Please continue to do your personal lifestyle choices by: daily care of teeth and gums, regular physical activity (goal should be 5 days a week for 30 minutes), eat a healthy diet, avoid tobacco and drug use, limiting any alcohol intake, taking a low-dose aspirin (if not allergic or have been advised by your provider otherwise) and taking vitamins and minerals as recommended by your provider. Continue doing brain stimulating activities (puzzles, reading, adult coloring books, staying active) to keep memory sharp. Continue to eat heart healthy diet (full of fruits, vegetables, whole grains, lean protein, water--limit salt, fat, and sugar intake) and increase physical activity as tolerated.  Next appointment: Please schedule your next Medicare Wellness Visit with your Nurse Health Advisor in 1 year by calling [phone].  Preventive Care 35 Years and Older, Male Preventive care refers to lifestyle choices and visits with your health care provider that  can promote health and wellness. What does preventive care include?  A yearly physical exam. This is also called an annual well check.  Dental exams once or twice a year.  Routine eye exams. Ask your health care provider how often you should have your eyes checked.  Personal lifestyle choices, including:  Daily care of your teeth and gums.  Regular physical activity.  Eating a healthy diet.  Avoiding tobacco and drug use.  Limiting alcohol use.  Practicing safe sex.  Taking low doses of aspirin every day.  Taking vitamin and mineral supplements as recommended by your health care provider. What happens during an annual well check? The services and screenings done by your health care provider during your annual well check will depend on your age, overall health, lifestyle risk factors, and family history of disease. Counseling  Your health care provider may ask you questions about your:  Alcohol use.  Tobacco use.  Drug use.  Emotional well-being.  Home and relationship well-being.  Sexual activity.  Eating habits.  History of falls.  Memory and ability to understand (cognition).  Work and work Statistician. Screening  You may have the following tests or measurements:  Height, weight, and BMI.  Blood pressure.  Lipid and cholesterol levels. These may be checked every 5 years, or more frequently if you are over 79 years old.  Skin check.  Lung cancer screening. You may have this screening every year starting at age 60 if you have a 30-pack-year history of smoking and currently smoke or have quit within the past 15 years.  Fecal occult blood test (FOBT) of the stool. You may have this test every year starting at age 70.  Flexible sigmoidoscopy or colonoscopy. You may have a sigmoidoscopy every 5 years  or a colonoscopy every 10 years starting at age 1.  Prostate cancer screening. Recommendations will vary depending on your family history and other  risks.  Hepatitis C blood test.  Hepatitis B blood test.  Sexually transmitted disease (STD) testing.  Diabetes screening. This is done by checking your blood sugar (glucose) after you have not eaten for a while (fasting). You may have this done every 1-3 years.  Abdominal aortic aneurysm (AAA) screening. You may need this if you are a current or former smoker.  Osteoporosis. You may be screened starting at age 90 if you are at high risk. Talk with your health care provider about your test results, treatment options, and if necessary, the need for more tests. Vaccines  Your health care provider may recommend certain vaccines, such as:  Influenza vaccine. This is recommended every year.  Tetanus, diphtheria, and acellular pertussis (Tdap, Td) vaccine. You may need a Td booster every 10 years.  Zoster vaccine. You may need this after age 39.  Pneumococcal 13-valent conjugate (PCV13) vaccine. One dose is recommended after age 38.  Pneumococcal polysaccharide (PPSV23) vaccine. One dose is recommended after age 44. Talk to your health care provider about which screenings and vaccines you need and how often you need them. This information is not intended to replace advice given to you by your health care provider. Make sure you discuss any questions you have with your health care provider. Document Released: 09/24/2015 Document Revised: 05/17/2016 Document Reviewed: 06/29/2015 Elsevier Interactive Patient Education  2017 [place] Prevention in the Home Falls can cause injuries. They can happen to people of all ages. There are many things you can do to make your home safe and to help prevent falls. What can I do on the outside of my home?  Regularly fix the edges of walkways and driveways and fix any cracks.  Remove anything that might make you trip as you walk through a door, such as a raised step or threshold.  Trim any bushes or trees on the path to your home.  Use  bright outdoor lighting.  Clear any walking paths of anything that might make someone trip, such as rocks or tools.  Regularly check to see if handrails are loose or broken. Make sure that both sides of any steps have handrails.  Any raised decks and porches should have guardrails on the edges.  Have any leaves, snow, or ice cleared regularly.  Use sand or salt on walking paths during winter.  Clean up any spills in your garage right away. This includes oil or grease spills. What can I do in the bathroom?  Use night lights.  Install grab bars by the toilet and in the tub and shower. Do not use towel bars as grab bars.  Use non-skid mats or decals in the tub or shower.  If you need to sit down in the shower, use a plastic, non-slip stool.  Keep the floor dry. Clean up any water that spills on the floor as soon as it happens.  Remove soap buildup in the tub or shower regularly.  Attach bath mats securely with double-sided non-slip rug tape.  Do not have throw rugs and other things on the floor that can make you trip. What can I do in the bedroom?  Use night lights.  Make sure that you have a light by your bed that is easy to reach.  Do not use any sheets or blankets that are too big for your  bed. They should not hang down onto the floor.  Have a firm chair that has side arms. You can use this for support while you get dressed.  Do not have throw rugs and other things on the floor that can make you trip. What can I do in the kitchen?  Clean up any spills right away.  Avoid walking on wet floors.  Keep items that you use a lot in easy-to-reach places.  If you need to reach something above you, use a strong step stool that has a grab bar.  Keep electrical cords out of the way.  Do not use floor polish or wax that makes floors slippery. If you must use wax, use non-skid floor wax.  Do not have throw rugs and other things on the floor that can make you trip. What can  I do with my stairs?  Do not leave any items on the stairs.  Make sure that there are handrails on both sides of the stairs and use them. Fix handrails that are broken or loose. Make sure that handrails are as long as the stairways.  Check any carpeting to make sure that it is firmly attached to the stairs. Fix any carpet that is loose or worn.  Avoid having throw rugs at the top or bottom of the stairs. If you do have throw rugs, attach them to the floor with carpet tape.  Make sure that you have a light switch at the top of the stairs and the bottom of the stairs. If you do not have them, ask someone to add them for you. What else can I do to help prevent falls?  Wear shoes that:  Do not have high heels.  Have rubber bottoms.  Are comfortable and fit you well.  Are closed at the toe. Do not wear sandals.  If you use a stepladder:  Make sure that it is fully opened. Do not climb a closed stepladder.  Make sure that both sides of the stepladder are locked into place.  Ask someone to hold it for you, if possible.  Clearly mark and make sure that you can see:  Any grab bars or handrails.  First and last steps.  Where the edge of each step is.  Use tools that help you move around (mobility aids) if they are needed. These include:  Canes.  Walkers.  Scooters.  Crutches.  Turn on the lights when you go into a dark area. Replace any light bulbs as soon as they burn out.  Set up your furniture so you have a clear path. Avoid moving your furniture around.  If any of your floors are uneven, fix them.  If there are any pets around you, be aware of where they are.  Review your medicines with your doctor. Some medicines can make you feel dizzy. This can increase your chance of falling. Ask your doctor what other things that you can do to help prevent falls. This information is not intended to replace advice given to you by your health care provider. Make sure you  discuss any questions you have with your health care provider. Document Released: 06/24/2009 Document Revised: 02/03/2016 Document Reviewed: 10/02/2014 Elsevier Interactive Patient Education  2017 [employer].

## 2020-10-07 NOTE — Assessment & Plan Note (Signed)
CMET 

## 2020-10-07 NOTE — Addendum Note (Signed)
Addended by: Cresenciano Lick on: 10/07/2020 09:22 AM   Modules accepted: Orders

## 2020-10-07 NOTE — Progress Notes (Signed)
Subjective:  Patient ID: John Rowe, male    DOB: Dec 09, 1945  Age: 75 y.o. MRN: [id number]  CC: Follow-up (6 month f/u- Flu shot)   HPI John Rowe presents for dyslipidemia, HTN, rosacea, B12 deficiency  Outpatient Medications Prior to Visit  Medication Sig Dispense Refill  . benazepril (LOTENSIN) 20 MG tablet Take 0.5 tablets (10 mg total) by mouth daily. 15 tablet 11  . cholecalciferol (VITAMIN D) 25 MCG (1000 UNIT) tablet Take 1 tablet (1,000 Units total) by mouth daily. 30 tablet 11  . Cyanocobalamin (VITAMIN B-12) 1000 MCG SUBL Place 1 tablet (1,000 mcg total) under the tongue daily. 100 tablet 3  . pravastatin (PRAVACHOL) 20 MG tablet Take 1 tablet (20 mg total) by mouth daily. 30 tablet 11  . aspirin EC 81 MG tablet Take 81 mg by mouth daily. (Patient not taking: Reported on 10/07/2020)    . metroNIDAZOLE (METROGEL) 1 % gel Apply topically daily. (Patient not taking: Reported on 10/07/2020) 60 g 3  . 0.9 %  sodium chloride infusion      No facility-administered medications prior to visit.    ROS: Review of Systems  Constitutional: Negative for appetite change, fatigue and unexpected weight change.  HENT: Negative for congestion, nosebleeds, sneezing, sore throat and trouble swallowing.   Eyes: Negative for itching and visual disturbance.  Respiratory: Negative for cough.   Cardiovascular: Negative for chest pain, palpitations and leg swelling.  Gastrointestinal: Negative for abdominal distention, blood in stool, diarrhea and nausea.  Genitourinary: Negative for frequency and hematuria.  Musculoskeletal: Negative for arthralgias, back pain, gait problem, joint swelling and neck pain.  Skin: Positive for rash.  Neurological: Negative for dizziness, tremors, speech difficulty and weakness.  Psychiatric/Behavioral: Negative for agitation, dysphoric mood, sleep disturbance and suicidal ideas. The patient is not nervous/anxious.     Objective:  BP 122/70 (BP Location:  Left Arm)   Pulse 87   Temp (!) 95 F (35 C)   Ht 5\' 8"  (1.727 m)   Wt 197 lb 6.4 oz (89.5 kg)   SpO2 95%   BMI 30.01 kg/m   BP Readings from Last 3 Encounters:  10/07/20 122/70  04/06/20 116/68  10/08/19 116/72    Wt Readings from Last 3 Encounters:  10/07/20 197 lb 6.4 oz (89.5 kg)  04/06/20 199 lb (90.3 kg)  10/08/19 198 lb (89.8 kg)    Physical Exam Constitutional:      General: He is not in acute distress.    Appearance: He is well-developed.     Comments: NAD  HENT:     Mouth/Throat:     Mouth: Oropharynx is clear and moist.  Eyes:     Conjunctiva/sclera: Conjunctivae normal.     Pupils: Pupils are equal, round, and reactive to light.  Neck:     Thyroid: No thyromegaly.     Vascular: No JVD.  Cardiovascular:     Rate and Rhythm: Normal rate and regular rhythm.     Pulses: Intact distal pulses.     Heart sounds: Normal heart sounds. No murmur heard. No friction rub. No gallop.   Pulmonary:     Effort: Pulmonary effort is normal. No respiratory distress.     Breath sounds: Normal breath sounds. No wheezing or rales.  Chest:     Chest wall: No tenderness.  Abdominal:     General: Bowel sounds are normal. There is no distension.     Palpations: Abdomen is soft. There is no mass.  Tenderness: There is no abdominal tenderness. There is no guarding or rebound.  Musculoskeletal:        General: No tenderness or edema. Normal range of motion.     Cervical back: Normal range of motion.  Lymphadenopathy:     Cervical: No cervical adenopathy.  Skin:    General: Skin is warm and dry.     Findings: Erythema and rash present.  Neurological:     Mental Status: He is alert and oriented to person, place, and time.     Cranial Nerves: No cranial nerve deficit.     Motor: No abnormal muscle tone.     Coordination: He displays a negative Romberg sign. Coordination normal.     Gait: Gait normal.     Deep Tendon Reflexes: Reflexes are normal and symmetric.   Psychiatric:        Mood and Affect: Mood and affect normal.        Behavior: Behavior normal.        Thought Content: Thought content normal.        Judgment: Judgment normal.   rosacea rash  Lab Results  Component Value Date   WBC 10.7 (H) 10/07/2018   HGB 14.6 10/07/2018   HCT 42.8 10/07/2018   PLT 218.0 10/07/2018   GLUCOSE 135 (H) 10/08/2019   CHOL 181 10/08/2019   TRIG 88.0 10/08/2019   HDL 55.40 10/08/2019   LDLDIRECT 145.8 07/01/2010   LDLCALC 108 (H) 10/08/2019   ALT 21 10/08/2019   AST 20 10/08/2019   NA 136 10/08/2019   K 4.1 10/08/2019   CL 102 10/08/2019   CREATININE 1.11 10/08/2019   BUN 15 10/08/2019   CO2 26 10/08/2019   TSH 1.17 10/07/2018   PSA 0.25 10/08/2019   INR 1.0 RATIO 06/26/2006   HGBA1C 5.7 10/08/2019    CT CARDIAC SCORING  Addendum Date: 05/16/2019   ADDENDUM REPORT: 05/16/2019 13:20 EXAM: OVER-READ INTERPRETATION  CT CHEST The following report is an over-read performed by radiologist Dr. Rosine Beat [facility] on 05/16/2019. This over-read does not include interpretation of cardiac or coronary anatomy or pathology. The cardiac CT a interpretation by the cardiologist is attached. COMPARISON:  CT chest 12/26/2016 FINDINGS: Visualized portion of the central chest shows no mediastinal or hilar lymphadenopathy. Visualized portion of the mid and distal esophagus is unremarkable. Ascending thoracic aorta measures up to 3.9 cm diameter. Atherosclerotic calcification noted descending thoracic aorta. Imaged lung parenchyma shows no worrisome pulmonary nodule or mass. No focal airspace consolidation. No pleural effusion. No worrisome lytic or sclerotic osseous abnormality. Old posterior left rib fractures noted. IMPRESSION: No acute findings within the extra cardiac anatomy of the visualized thorax. Aortic Atherosclerois (ICD10-170.0) Electronically Signed   By: Misty Stanley M.D.   On: 05/16/2019 13:20   Result Date: 05/16/2019 CLINICAL DATA:   Risk stratification EXAM: Coronary Calcium Score TECHNIQUE: The patient was scanned on a [employer]. Axial non-contrast 3 mm slices were carried out through the heart. The data set was analyzed on a dedicated work station and scored using the [place]. FINDINGS: Non-cardiac: See separate report from [facility]. Ascending Aorta: 39 mm, normal size. Descending Aorta: 27 mm, normal size with mild diffuse calcifications. Pericardium: Normal Coronary arteries: Normal origin. IMPRESSION: Coronary calcium score of 10 . This was 12 percentile for age and sex matched control. Candee Furbish, MD Electronically Signed: By: Candee Furbish MD On: 05/16/2019 11:27    Assessment & Plan:  Walker Kehr, MD

## 2020-10-07 NOTE — Progress Notes (Addendum)
Subjective:   John Rowe is a 75 y.o. male who presents for Medicare Annual/Subsequent preventive examination.  Review of Systems    No ROS. Medicare Wellness Visit. Additional risk factors are reflected in social history. Cardiac Risk Factors include: advanced age (>65men, >46 women);dyslipidemia;hypertension;male gender;obesity (BMI >30kg/m2)     Objective:    Today's Vitals   10/07/20 0920  BP: 122/70  Pulse: 87  Temp: 97.7 F (36.5 C)  SpO2: 95%  Weight: 197 lb 5 oz (89.5 kg)  Height: 5\' 8"  (1.727 m)  PainSc: 0-No pain   Body mass index is 30 kg/m.  Advanced Directives 04/02/2018 12/21/2017 10/11/2017  Does Patient Have a Medical Advance Directive? No No No  Would patient like information on creating a medical advance directive? No - Patient declined - -    Current Medications (verified) Outpatient Encounter Medications as of 10/07/2020  Medication Sig   benazepril (LOTENSIN) 20 MG tablet Take 0.5 tablets (10 mg total) by mouth daily.   cholecalciferol (VITAMIN D) 25 MCG (1000 UNIT) tablet Take 1 tablet (1,000 Units total) by mouth daily.   Cyanocobalamin (VITAMIN B-12) 1000 MCG SUBL Place 1 tablet (1,000 mcg total) under the tongue daily.   pravastatin (PRAVACHOL) 20 MG tablet Take 1 tablet (20 mg total) by mouth daily.   [DISCONTINUED] 0.9 %  sodium chloride infusion    No facility-administered encounter medications on file as of 10/07/2020.    Allergies (verified) Oxycodone-acetaminophen and Tetracycline hcl   History: Past Medical History:  Diagnosis Date   Arthritis    Cataract    beginning stage  both eyes   Chest pain    Hearing loss of left ear    Hyperlipidemia    Hypertension    Meniere disease    Past Surgical History:  Procedure Laterality Date   COLONOSCOPY     ESOPHAGOGASTRODUODENOSCOPY (EGD) WITH PROPOFOL N/A 10/11/2017   Procedure: ESOPHAGOGASTRODUODENOSCOPY (EGD) WITH PROPOFOL;  Surgeon: John Banister, MD;  Location: [facility]  ENDOSCOPY;  Service: Endoscopy;  Laterality: N/A;   POLYPECTOMY     UPPER GASTROINTESTINAL ENDOSCOPY     Family History  Adopted: Yes  Problem Relation Age of Onset   Other Father        Deceased, sepsis   Healthy Child    Other Grandchild        1p36 syndrome   Colon polyps Neg Hx    Colon cancer Neg Hx    Esophageal cancer Neg Hx    Stomach cancer Neg Hx    Rectal cancer Neg Hx    Social History   Socioeconomic History   Marital status: Married    Spouse name: Not on file   Number of children: 3   Years of education: Not on file   Highest education level: Not on file  Occupational History   Occupation: retired  Tobacco Use   Smoking status: Former Smoker    Quit date: 09/12/1979    Years since quitting: 41.0   Smokeless tobacco: Never Used  Vaping Use   Vaping Use: Never used  Substance and Sexual Activity   Alcohol use: Yes    Alcohol/week: 0.0 standard drinks    Comment: Drinks 3-4 beers nightly x 50 years   Drug use: No   Sexual activity: Yes  Other Topics Concern   Not on file  Social History Narrative   Lives with wife in a 4 story home.  Has 3 children and 7 grandkids.  Retired from  [employer].     Education: some college.     Social Determinants of Health   Financial Resource Strain: Low Risk    Difficulty of Paying Living Expenses: Not hard at all  Food Insecurity: No Food Insecurity   Worried About Charity fundraiser in the Last Year: Never true   [place] in the Last Year: Never true  Transportation Needs: No Transportation Needs   Lack of Transportation (Medical): No   Lack of Transportation (Non-Medical): No  Physical Activity: Sufficiently Active   Days of Exercise per Week: 5 days   Minutes of Exercise per Session: 30 min  Stress: No Stress Concern Present   Feeling of Stress : Not at all  Social Connections: Socially Integrated   Frequency of Communication with Friends and Family: More than three times a week   Frequency of Social  Gatherings with Friends and Family: More than three times a week   Attends Religious Services: More than 4 times per year   Active Member of [employer] or Organizations: Yes   Attends Music therapist: More than 4 times per year   Marital Status: Married    Tobacco Counseling Counseling given: Not Answered   Clinical Intake:  Pre-visit preparation completed: Yes  Pain : No/denies pain Pain Score: 0-No pain     BMI - recorded: 30 Nutritional Status: BMI > 30  Obese Nutritional Risks: None Diabetes: No  How often do you need to have someone help you when you read instructions, pamphlets, or other written materials from your doctor or pharmacy?: 1 - Never What is the last grade level you completed in school?: High School Graduate; some collehe  Diabetic? no  Interpreter Needed?: No  Information entered by :: John Abu, LPN   Activities of Daily Living In your present state of health, do you have any difficulty performing the following activities: 10/07/2020  Hearing? Y  Vision? N  Difficulty concentrating or making decisions? N  Walking or climbing stairs? N  Dressing or bathing? N  Doing errands, shopping? N  Preparing Food and eating ? N  Using the Toilet? N  In the past six months, have you accidently leaked urine? N  Do you have problems with loss of bowel control? N  Managing your Medications? N  Managing your Finances? N  Housekeeping or managing your Housekeeping? N  Some recent data might be hidden    Patient Care Team: Plotnikov, John Lacks, MD as PCP - General (Internal Medicine)  Indicate any recent Medical Services you may have received from other than [facility] providers in the past year (date may be approximate).     Assessment:   This is a routine wellness examination for John Rowe.  Hearing/Vision screen No exam data present  Dietary issues and exercise activities discussed: Current Exercise Habits: Home exercise routine, Type of  exercise: strength training/weights;walking, Time (Minutes): 30, Frequency (Times/Week): 5, Weekly Exercise (Minutes/Week): 150, Intensity: Moderate, Exercise limited by: None identified  Goals      Patient Stated     Stay as healthy and as independent as possible. Continue to be active physically and socially. Enjoy life and family.       Depression Screen PHQ 2/9 Scores 10/07/2020 10/07/2020 04/07/2019 04/02/2018 04/02/2017 09/27/2015 08/25/2013  PHQ - 2 Score 0 0 0 0 0 0 0    Fall Risk Fall Risk  10/07/2020 10/07/2020 04/06/2020 04/07/2019 04/02/2018  Falls in the past year? 0 0 0 0 Yes  Number  falls in past yr: 0 0 0 0 -  Injury with Fall? 0 0 0 0 -  Risk for fall due to : No Fall Risks - - - -    FALL RISK PREVENTION PERTAINING TO THE HOME:  Any stairs in or around the home? Yes  If so, are there any without handrails? No  Home free of loose throw rugs in walkways, pet beds, electrical cords, etc? Yes  Adequate lighting in your home to reduce risk of falls? Yes   ASSISTIVE DEVICES UTILIZED TO PREVENT FALLS:  Life alert? No  Use of a cane, walker or w/c? No  Grab bars in the bathroom? No  Shower chair or bench in shower? No  Elevated toilet seat or a handicapped toilet? No   TIMED UP AND GO:  Was the test performed? No .  Length of time to ambulate 10 feet: 0 sec.   Gait steady and fast without use of assistive device  Cognitive Function: Normal cognitive status assessed by direct observation by this Nurse Health Advisor. No abnormalities found.         Immunizations Immunization History  Administered Date(s) Administered   Influenza, High Dose Seasonal PF 09/25/2016   Influenza,inj,Quad PF,6+ Mos 09/24/2014   PFIZER(Purple Top)SARS-COV-2 Vaccination 11/09/2019, 12/09/2019, 07/25/2020   Pneumococcal Conjugate-13 09/24/2014   Pneumococcal Polysaccharide-23 04/06/2010   Td 09/11/2005   Tdap 10/07/2018    TDAP status: Up to date  Flu Vaccine status: Up to  date  Pneumococcal vaccine status: Up to date  Covid-19 vaccine status: Completed vaccines  Qualifies for Shingles Vaccine? Yes   Zostavax completed No   Shingrix Completed?: No.    Education has been provided regarding the importance of this vaccine. Patient has been advised to call insurance company to determine out of pocket expense if they have not yet received this vaccine. Advised may also receive vaccine at local pharmacy or Health Dept. Verbalized acceptance and understanding.  Screening Tests Health Maintenance  Topic Date Due   Hepatitis C Screening  Never done   INFLUENZA VACCINE  04/11/2020   COVID-19 Vaccine (4 - Booster for Pfizer series) 01/22/2021   TETANUS/TDAP  10/07/2028   PNA vac Low Risk Adult  Completed    Health Maintenance  Health Maintenance Due  Topic Date Due   Hepatitis C Screening  Never done   INFLUENZA VACCINE  04/11/2020    Colorectal cancer screening: Type of screening: Cologuard. Completed 03/28/2018. Repeat every 3 years  Lung Cancer Screening: (Low Dose CT Chest recommended if Age 53-80 years, 30 pack-year currently smoking OR have quit w/in 15years.) does not qualify.   Lung Cancer Screening Referral: no  Additional Screening:  Hepatitis C Screening: does qualify; Completed no  Vision Screening: Recommended annual ophthalmology exams for early detection of glaucoma and other disorders of the eye. Is the patient up to date with their annual eye exam?  Yes  Who is the provider or what is the name of the office in which the patient attends annual eye exams? [facility]  If pt is not established with a provider, would they like to be referred to a provider to establish care? No .   Dental Screening: Recommended annual dental exams for proper oral hygiene  Community Resource Referral / Chronic Care Management: CRR required this visit?  No   CCM required this visit?  No      Plan:     I have personally reviewed and noted the  following in  the patient's chart:   Medical and social history Use of alcohol, tobacco or illicit drugs  Current medications and supplements Functional ability and status Nutritional status Physical activity Advanced directives List of other physicians Hospitalizations, surgeries, and ER visits in previous 12 months Vitals Screenings to include cognitive, depression, and falls Referrals and appointments  In addition, I have reviewed and discussed with patient certain preventive protocols, quality metrics, and best practice recommendations. A written personalized care plan for preventive services as well as general preventive health recommendations were provided to patient.     Sheral Flow, LPN   1/61/0960   Nurse Notes: n/a   Medical screening examination/treatment/procedure(s) were performed by non-physician practitioner and as supervising physician I was immediately available for consultation/collaboration.  I agree with above. Lew Dawes, MD

## 2020-10-07 NOTE — Assessment & Plan Note (Signed)
On Pravastatin 

## 2020-10-07 NOTE — Addendum Note (Signed)
Addended by: Aris Even N on: 10/07/2020 09:23 AM   Modules accepted: Orders  

## 2020-10-07 NOTE — Assessment & Plan Note (Signed)
On B12 

## 2020-10-07 NOTE — Addendum Note (Signed)
Addended by: Alejandro Adcox N on: 10/07/2020 09:23 AM   Modules accepted: Orders  

## 2020-10-10 DIAGNOSIS — I2510 Atherosclerotic heart disease of native coronary artery without angina pectoris: Secondary | ICD-10-CM | POA: Insufficient documentation

## 2020-10-10 NOTE — Assessment & Plan Note (Signed)
Coronary calcium CT score of 10  Continue with pravastatin

## 2020-10-12 ENCOUNTER — Other Ambulatory Visit: Payer: Self-pay | Admitting: Internal Medicine

## 2020-10-12 DIAGNOSIS — R944 Abnormal results of kidney function studies: Secondary | ICD-10-CM

## 2020-10-12 DIAGNOSIS — R739 Hyperglycemia, unspecified: Secondary | ICD-10-CM

## 2020-10-19 ENCOUNTER — Ambulatory Visit
Admission: RE | Admit: 2020-10-19 | Discharge: 2020-10-19 | Disposition: A | Discharge status: Home / Self Care | Payer: Medicare Other | Source: Ambulatory Visit | Attending: Internal Medicine | Admitting: Internal Medicine

## 2020-10-19 DIAGNOSIS — R944 Abnormal results of kidney function studies: Secondary | ICD-10-CM

## 2021-04-05 ENCOUNTER — Encounter: Payer: Self-pay | Admitting: Internal Medicine

## 2021-04-05 ENCOUNTER — Other Ambulatory Visit: Payer: Self-pay | Admitting: Internal Medicine

## 2021-04-05 ENCOUNTER — Ambulatory Visit (INDEPENDENT_AMBULATORY_CARE_PROVIDER_SITE_OTHER): Payer: Medicare Other | Admitting: Internal Medicine

## 2021-04-05 ENCOUNTER — Other Ambulatory Visit: Payer: Self-pay

## 2021-04-05 DIAGNOSIS — R739 Hyperglycemia, unspecified: Secondary | ICD-10-CM | POA: Diagnosis not present

## 2021-04-05 DIAGNOSIS — I10 Essential (primary) hypertension: Secondary | ICD-10-CM | POA: Diagnosis not present

## 2021-04-05 DIAGNOSIS — R944 Abnormal results of kidney function studies: Secondary | ICD-10-CM | POA: Diagnosis not present

## 2021-04-05 DIAGNOSIS — E538 Deficiency of other specified B group vitamins: Secondary | ICD-10-CM

## 2021-04-05 DIAGNOSIS — E785 Hyperlipidemia, unspecified: Secondary | ICD-10-CM | POA: Diagnosis not present

## 2021-04-05 DIAGNOSIS — C4331 Malignant melanoma of nose: Secondary | ICD-10-CM

## 2021-04-05 DIAGNOSIS — I2510 Atherosclerotic heart disease of native coronary artery without angina pectoris: Secondary | ICD-10-CM

## 2021-04-05 DIAGNOSIS — I2583 Coronary atherosclerosis due to lipid rich plaque: Secondary | ICD-10-CM

## 2021-04-05 LAB — COMPREHENSIVE METABOLIC PANEL
ALT: 14 U/L (ref 0–53)
AST: 14 U/L (ref 0–37)
Albumin: 4.1 g/dL (ref 3.5–5.2)
Alkaline Phosphatase: 51 U/L (ref 39–117)
BUN: 14 mg/dL (ref 6–23)
CO2: 27 mEq/L (ref 19–32)
Calcium: 9.1 mg/dL (ref 8.4–10.5)
Chloride: 102 mEq/L (ref 96–112)
Creatinine, Ser: 1.28 mg/dL (ref 0.40–1.50)
GFR: 53.98 mL/min — ABNORMAL LOW (ref >60.00)
Glucose, Bld: 118 mg/dL — ABNORMAL HIGH (ref 70–99)
Potassium: 4.1 mEq/L (ref 3.5–5.1)
Sodium: 136 mEq/L (ref 135–145)
Total Bilirubin: 0.7 mg/dL (ref 0.2–1.2)
Total Protein: 6.9 g/dL (ref 6.0–8.3)

## 2021-04-05 LAB — HEMOGLOBIN A1C: Hgb A1c MFr Bld: 5.6 % (ref 4.6–6.5)

## 2021-04-05 MED ORDER — BENAZEPRIL HCL 20 MG PO TABS: 10.0000 mg | ORAL_TABLET | Freq: Every day | ORAL | 11 refills | Status: DC

## 2021-04-05 MED ORDER — BENAZEPRIL HCL 10 MG PO TABS: 10.0000 mg | ORAL_TABLET | Freq: Every day | ORAL | 3 refills | Status: DC

## 2021-04-05 MED ORDER — VITAMIN D3 25 MCG (1000 UNIT) PO TABS: 1000.0000 [IU] | ORAL_TABLET | Freq: Every day | ORAL | 11 refills | Status: DC

## 2021-04-05 MED ORDER — PRAVASTATIN SODIUM 20 MG PO TABS: 20.0000 mg | ORAL_TABLET | Freq: Every day | ORAL | 11 refills | Status: DC

## 2021-04-05 NOTE — Assessment & Plan Note (Signed)
On Pravastatin 

## 2021-04-05 NOTE — Progress Notes (Signed)
Subjective:  Patient ID: John Rowe, male    DOB: 12-02-1945  Age: 75 y.o. MRN: [id number]  CC: Follow-up (6 MONTH F/U)   HPI Nilsa Nutting presents for HTN, CAD, dyslipidemia  Outpatient Medications Prior to Visit  Medication Sig Dispense Refill   Cyanocobalamin (VITAMIN B-12) 1000 MCG SUBL Place 1 tablet (1,000 mcg total) under the tongue daily. 100 tablet 3   benazepril (LOTENSIN) 20 MG tablet Take 0.5 tablets (10 mg total) by mouth daily. 15 tablet 11   cholecalciferol (VITAMIN D) 25 MCG (1000 UNIT) tablet Take 1 tablet (1,000 Units total) by mouth daily. 30 tablet 11   pravastatin (PRAVACHOL) 20 MG tablet Take 1 tablet (20 mg total) by mouth daily. 30 tablet 11   No facility-administered medications prior to visit.    ROS: Review of Systems  Constitutional:  Negative for appetite change, fatigue and unexpected weight change.  HENT:  Negative for congestion, nosebleeds, sneezing, sore throat and trouble swallowing.   Eyes:  Negative for itching and visual disturbance.  Respiratory:  Negative for cough.   Cardiovascular:  Negative for chest pain, palpitations and leg swelling.  Gastrointestinal:  Negative for abdominal distention, blood in stool, diarrhea and nausea.  Genitourinary:  Negative for frequency and hematuria.  Musculoskeletal:  Positive for arthralgias. Negative for back pain, gait problem, joint swelling and neck pain.  Skin:  Negative for rash.  Neurological:  Negative for dizziness, tremors, speech difficulty and weakness.  Psychiatric/Behavioral:  Negative for agitation, dysphoric mood, sleep disturbance and suicidal ideas. The patient is not nervous/anxious.    Objective:  BP 118/62 (BP Location: Left Arm)   Pulse 72   Temp 98 F (36.7 C) (Oral)   Ht '5\' 8"'$  (1.727 m)   Wt 196 lb 6.4 oz (89.1 kg)   SpO2 96%   BMI 29.86 kg/m   BP Readings from Last 3 Encounters:  04/05/21 118/62  10/07/20 122/70  10/07/20 122/70    Wt Readings from Last 3  Encounters:  04/05/21 196 lb 6.4 oz (89.1 kg)  10/07/20 197 lb 5 oz (89.5 kg)  10/07/20 197 lb 6.4 oz (89.5 kg)    Physical Exam Constitutional:      General: He is not in acute distress.    Appearance: He is well-developed. He is obese.     Comments: NAD  Eyes:     Conjunctiva/sclera: Conjunctivae normal.     Pupils: Pupils are equal, round, and reactive to light.  Neck:     Thyroid: No thyromegaly.     Vascular: No JVD.  Cardiovascular:     Rate and Rhythm: Normal rate and regular rhythm.     Heart sounds: Normal heart sounds. No murmur heard.   No friction rub. No gallop.  Pulmonary:     Effort: Pulmonary effort is normal. No respiratory distress.     Breath sounds: Normal breath sounds. No wheezing or rales.  Chest:     Chest wall: No tenderness.  Abdominal:     General: Bowel sounds are normal. There is no distension.     Palpations: Abdomen is soft. There is no mass.     Tenderness: There is no abdominal tenderness. There is no guarding or rebound.  Musculoskeletal:        General: No tenderness. Normal range of motion.     Cervical back: Normal range of motion.  Lymphadenopathy:     Cervical: No cervical adenopathy.  Skin:    General: Skin is warm and  dry.     Findings: No rash.  Neurological:     Mental Status: He is alert and oriented to person, place, and time.     Cranial Nerves: No cranial nerve deficit.     Motor: No abnormal muscle tone.     Coordination: Coordination normal.     Gait: Gait normal.     Deep Tendon Reflexes: Reflexes are normal and symmetric.  Psychiatric:        Behavior: Behavior normal.        Thought Content: Thought content normal.        Judgment: Judgment normal.    Lab Results  Component Value Date   WBC 8.9 10/07/2020   HGB 14.5 10/07/2020   HCT 42.2 10/07/2020   PLT 202.0 10/07/2020   GLUCOSE 130 (H) 10/07/2020   CHOL 176 10/07/2020   TRIG 103.0 10/07/2020   HDL 53.90 10/07/2020   LDLDIRECT 145.8 07/01/2010    LDLCALC 102 (H) 10/07/2020   ALT 17 10/07/2020   AST 17 10/07/2020   NA 134 (L) 10/07/2020   K 4.5 10/07/2020   CL 99 10/07/2020   CREATININE 1.19 10/07/2020   BUN 15 10/07/2020   CO2 29 10/07/2020   TSH 1.00 10/07/2020   PSA 0.16 10/07/2020   INR 1.0 RATIO 06/26/2006   HGBA1C 5.7 10/08/2019    US Renal  Result Date: 10/19/2020 CLINICAL DATA:  Decreased GFR EXAM: RENAL / URINARY TRACT ULTRASOUND COMPLETE COMPARISON:  None. FINDINGS: Right Kidney: Renal measurements: 12.2 x 5.4 x 5.3 cm. = volume: 183 mL. Echogenicity within normal limits. No mass or hydronephrosis visualized. Left Kidney: Renal measurements: 13.4 x 5.9 x 6.0 cm = volume: 248 mL. Some suggestion of mild cortical thinning is noted. No mass lesion or hydronephrosis is noted. Bladder: Appears normal for degree of bladder distention. Other: None. IMPRESSION: Suggestion of cortical thinning on the left although this may be related to the larger size of the left kidney. No other focal abnormality is noted. Electronically Signed   By: Inez Catalina M.D.   On: 10/19/2020 16:16    Assessment & Plan:   There are no diagnoses linked to this encounter.   Meds ordered this encounter  Medications   benazepril (LOTENSIN) 20 MG tablet    Sig: Take 0.5 tablets (10 mg total) by mouth daily.    Dispense:  15 tablet    Refill:  11   cholecalciferol (VITAMIN D) 25 MCG (1000 UNIT) tablet    Sig: Take 1 tablet (1,000 Units total) by mouth daily.    Dispense:  30 tablet    Refill:  11   pravastatin (PRAVACHOL) 20 MG tablet    Sig: Take 1 tablet (20 mg total) by mouth daily.    Dispense:  30 tablet    Refill:  11     Follow-up: No follow-ups on file.  Walker Kehr, MD

## 2021-04-05 NOTE — Assessment & Plan Note (Signed)
Check A1c. 

## 2021-04-05 NOTE — Assessment & Plan Note (Signed)
The pt was encouraged to see a dermatologist q 12 months

## 2021-04-05 NOTE — Assessment & Plan Note (Signed)
On B12 

## 2021-04-05 NOTE — Assessment & Plan Note (Addendum)
Low BP On Lotensin --  reduce to 10 mg if low BP

## 2021-04-05 NOTE — Addendum Note (Signed)
Addended by: Jacobo Forest on: 04/05/2021 10:49 AM   Modules accepted: Orders

## 2021-04-18 ENCOUNTER — Telehealth: Payer: Self-pay | Admitting: Internal Medicine

## 2021-04-18 NOTE — Telephone Encounter (Signed)
Patient calling in to check if provider had time to respond to prev message.  Adv patient provider has not reviewed yet & to check back late in the day

## 2021-04-18 NOTE — Telephone Encounter (Signed)
   Patient covid+ home test 8/8 Cough, congestion, sore throat Patient requesting medication and advice

## 2021-04-19 NOTE — Telephone Encounter (Signed)
   Patient advised of Dr Enis Slipper recommendation. He states he is getting worse. Has body ache  and runny nose, and sore throat. Patient requesting anti viral

## 2021-04-19 NOTE — Telephone Encounter (Signed)
For a mild COVID-19 case - take zinc 50 mg a day for 1 week, vitamin C 1000 mg daily for 1 week, vitamin D2 50,000 units weekly for 2 months (unless  taking vitamin D daily already). Take Allegra or Benadryl.  Maintain good oral hydration and take Tylenol for high fever.  Call if problems. Isolate for 5 days, then wear a mask for 5 days per CDC.  If not better or worse-we need to call an antiviral medication.  Let me know. Thanks

## 2021-04-20 MED ORDER — MOLNUPIRAVIR EUA 200MG CAPSULE: 4.0000 | ORAL_CAPSULE | Freq: Two times a day (BID) | ORAL | 0 refills | Status: AC

## 2021-04-20 NOTE — Telephone Encounter (Signed)
Notified pt MD sent [facility] 

## 2021-04-20 NOTE — Telephone Encounter (Signed)
Molnupiravir Rx emailed to [facility] Thx

## 2021-04-20 NOTE — Addendum Note (Signed)
Addended by: Cassandria Anger on: 04/20/2021 07:23 AM   Modules accepted: Orders

## 2021-04-30 IMAGING — CT CT HEART SCORING
2 series · 16 of 20 positions shown, 18 images · non-contrast
Comparison: CT chest 12/26/2016

Addendum:
CLINICAL DATA: Risk stratification

EXAM:
Coronary Calcium Score
TECHNIQUE: The patient was scanned on a Siemens Force scanner. Axial
non-contrast 3 mm slices were carried out through the heart. The
data set was analyzed on a dedicated work station and scored using
the Agatson method.

[Series 3: casc 3.0 i36f 2 bestdiast 66 % · axial · 0.39mm/px · z∈[-257,-173]mm · 8 of 36 slices shown, 10 images]
[im 4/36  vessel]
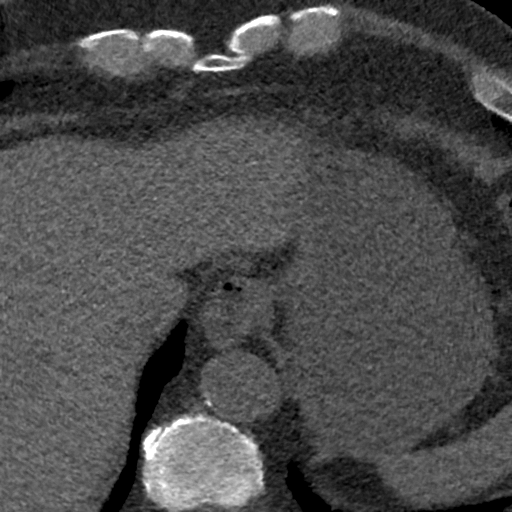
[im 4/36  lung]
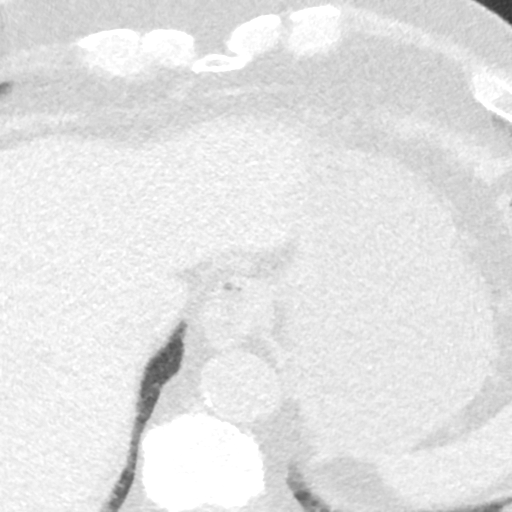
[im 8/36  vessel]
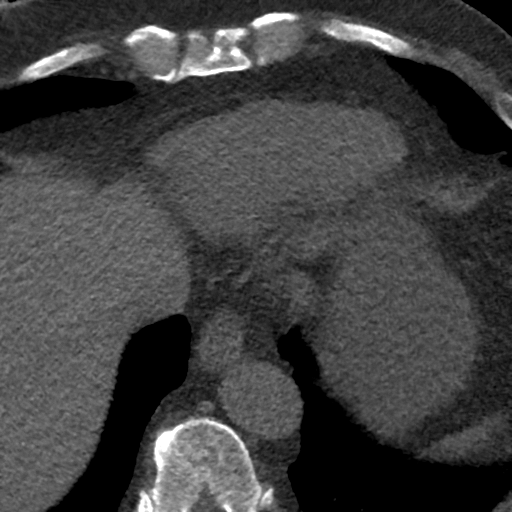
[im 12/36  vessel]
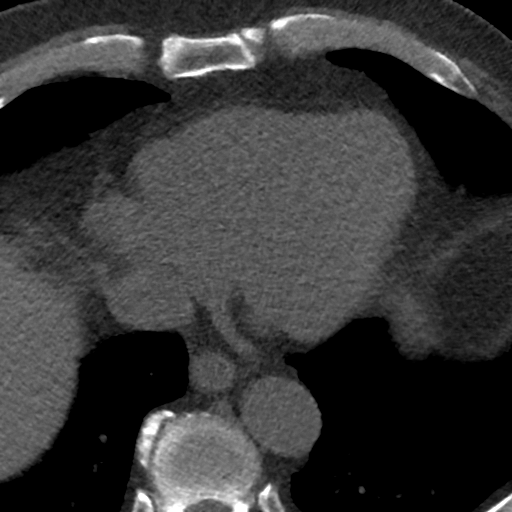
[im 16/36  vessel]
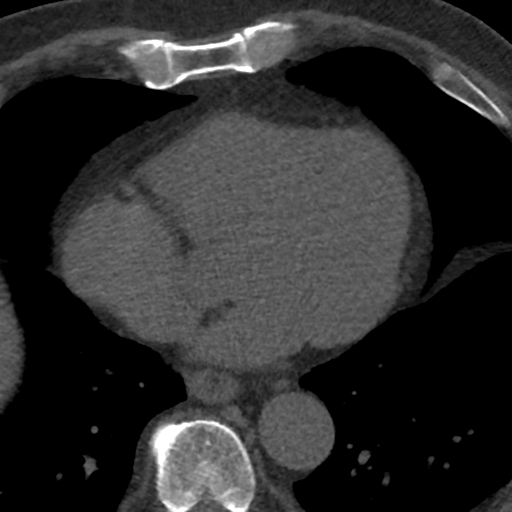
[im 20/36  vessel]
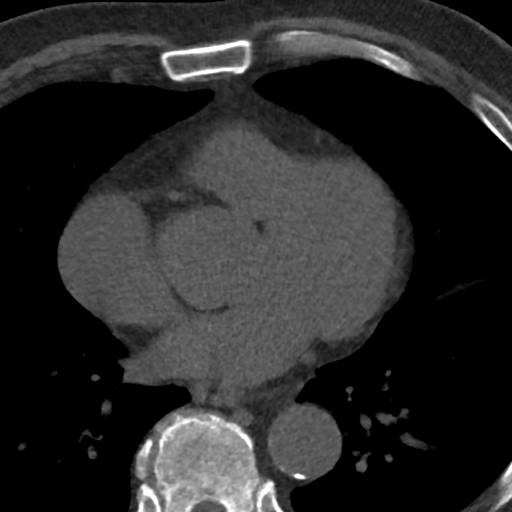
[im 20/36  lung]
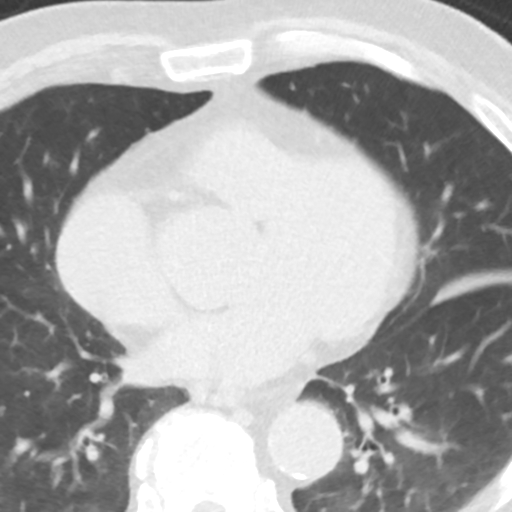
[im 24/36  vessel]
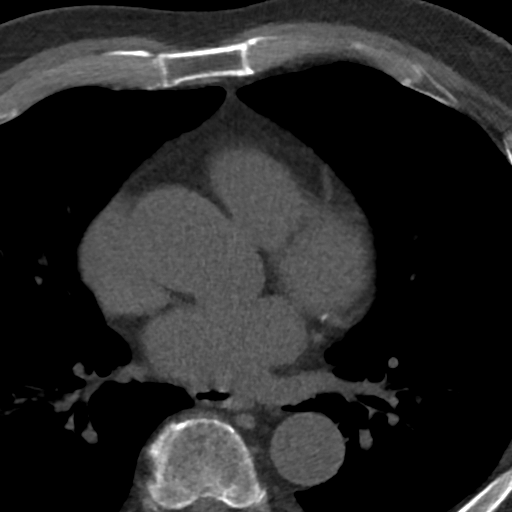
[im 28/36  vessel]
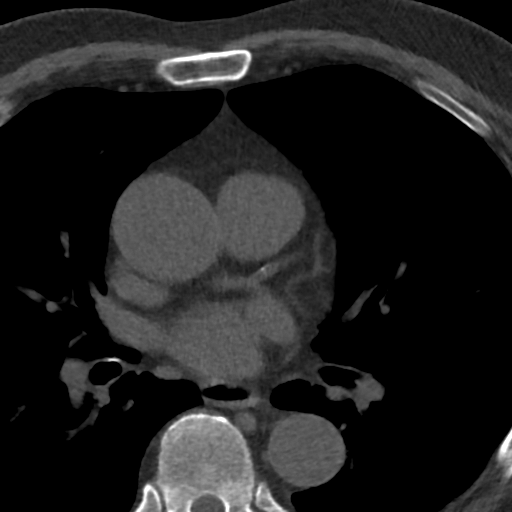
[im 32/36  vessel]
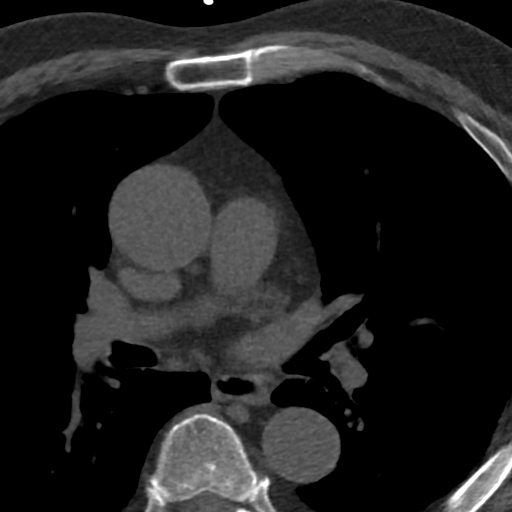

[Series 5: lung st 67 % · axial · 0.68mm/px · z∈[-257,-173]mm · 8 of 36 slices shown]
[im 4/36  lung]
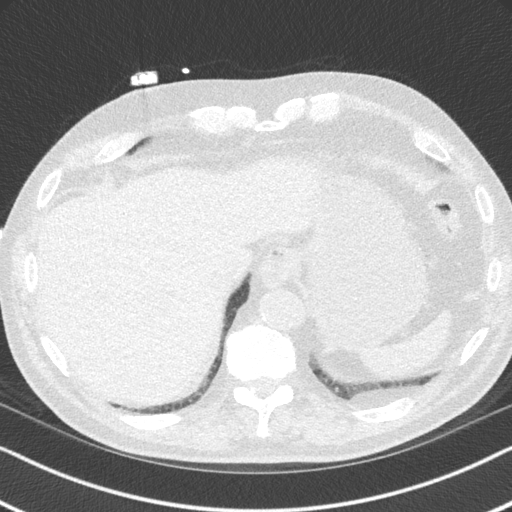
[im 8/36  lung]
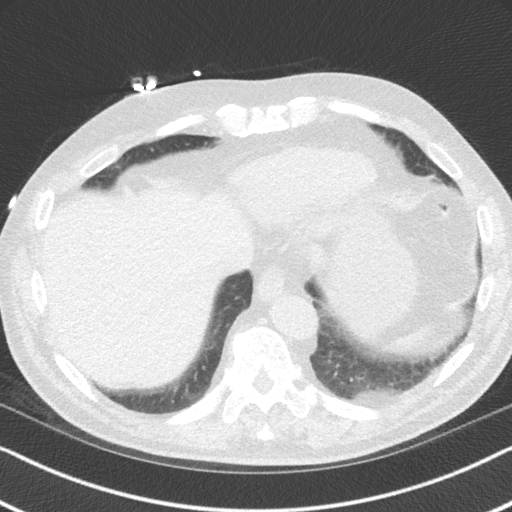
[im 12/36  lung]
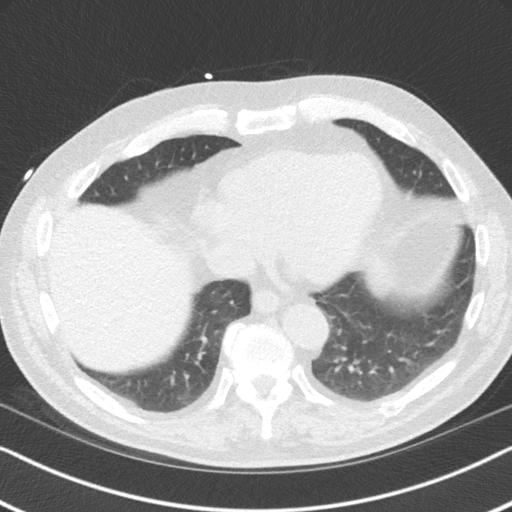
[im 16/36  lung]
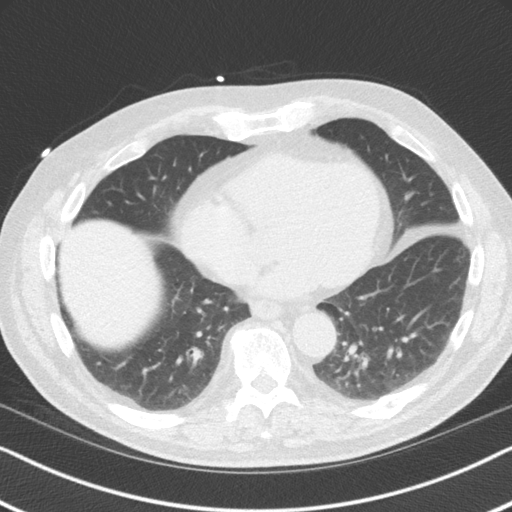
[im 20/36  lung]
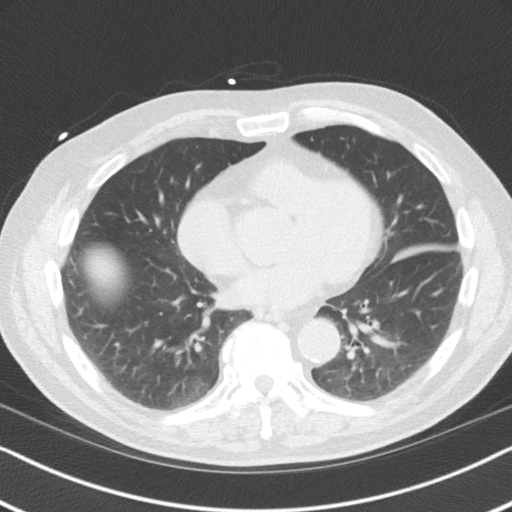
[im 24/36  lung]
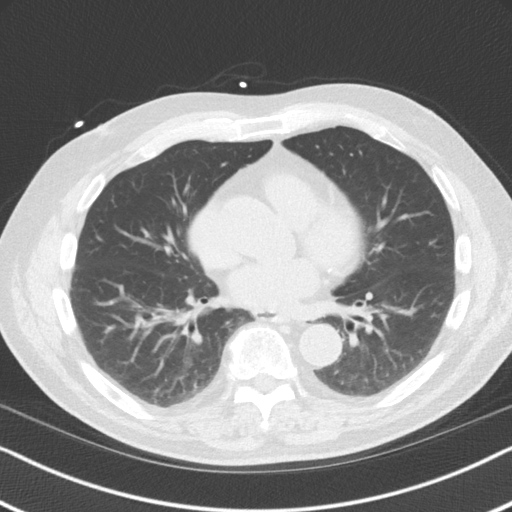
[im 28/36  lung]
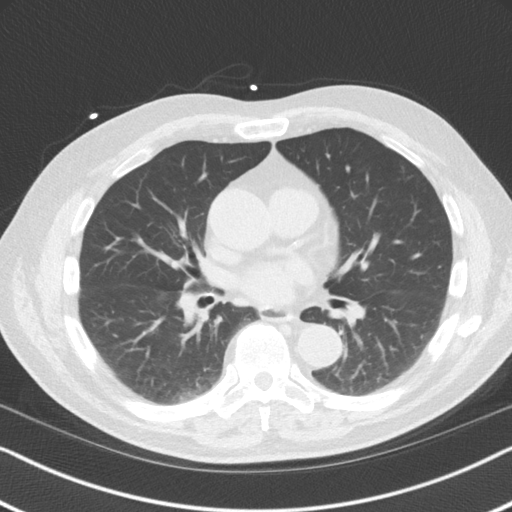
[im 32/36  lung]
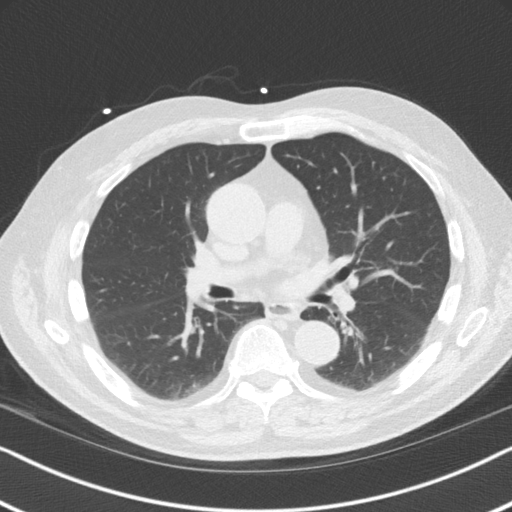

[16 of 20 positions shown; findings below may reference images not displayed]

FINDINGS: Non-cardiac: See separate report from [REDACTED].

Ascending Aorta: 39 mm, normal size.

Descending Aorta: 27 mm, normal size with mild diffuse
calcifications.

Pericardium: Normal

Coronary arteries: Normal origin.
IMPRESSION: Coronary calcium score of 10 . This was 12 percentile for age and
sex matched control.

EXAM:
OVER-READ INTERPRETATION  CT CHEST

The following report is an over-read performed by radiologist Dr.
Schwaggy Musicale [REDACTED] on 05/16/2019. This over-read
does not include interpretation of cardiac or coronary anatomy or
pathology. The cardiac CT a interpretation by the cardiologist is
attached.
FINDINGS: Visualized portion of the central chest shows no mediastinal or
hilar lymphadenopathy. Visualized portion of the mid and distal
esophagus is unremarkable. Ascending thoracic aorta measures up to
3.9 cm diameter. Atherosclerotic calcification noted descending
thoracic aorta.

Imaged lung parenchyma shows no worrisome pulmonary nodule or mass.
No focal airspace consolidation. No pleural effusion.

No worrisome lytic or sclerotic osseous abnormality. Old posterior
left rib fractures noted.
IMPRESSION: No acute findings within the extra cardiac anatomy of the visualized
thorax.

Aortic Atherosclerois ([id number])

*** End of Addendum ***
FINDINGS: Non-cardiac: See separate report from [REDACTED].

Ascending Aorta: 39 mm, normal size.

Descending Aorta: 27 mm, normal size with mild diffuse
calcifications.

Pericardium: Normal

Coronary arteries: Normal origin.
IMPRESSION: Coronary calcium score of 10 . This was 12 percentile for age and
sex matched control.

## 2021-09-07 ENCOUNTER — Telehealth: Payer: Self-pay | Admitting: Internal Medicine

## 2021-09-07 ENCOUNTER — Encounter: Payer: Self-pay | Admitting: Internal Medicine

## 2021-09-07 NOTE — Telephone Encounter (Signed)
The patient can take Tylenol 650 mg 4 times a day as needed. Use Salonpas patch. He needs to see any provider if not better. Thanks

## 2021-09-07 NOTE — Telephone Encounter (Signed)
Patient calling in  Patient says he has been having a lot of sciatic nerve pain (near his buttocks/hip area) that's going down into his right leg  Wants some OTC recommendations from the provider  Please call [phone]

## 2021-09-08 NOTE — Telephone Encounter (Signed)
LDVM for pt of Dr. Plotnikov's instructions. 

## 2021-09-13 ENCOUNTER — Encounter: Payer: Self-pay | Admitting: Internal Medicine

## 2021-09-13 NOTE — Telephone Encounter (Signed)
Patient states the tylenol and patches recommended for pain is not helping  Offered patient next ov on 1-11 w/ another provider, patient declined  Patient requesting a rx for pain  Please advise

## 2021-09-14 NOTE — Telephone Encounter (Signed)
Called pt there was no answer left msg to return call. Also sent mychart letting pt know MD would work him in this afternoon. Waiting on response from patient.Marland KitchenJohny Chess

## 2021-09-14 NOTE — Telephone Encounter (Signed)
Patient states he does not need the 4:30 appt for today due to him already being seen  *see below*

## 2021-10-07 ENCOUNTER — Telehealth: Payer: Self-pay | Admitting: Internal Medicine

## 2021-10-07 NOTE — Telephone Encounter (Signed)
Left message for patient to call back to schedule Medicare Annual Wellness Visit   Last AWV  10/07/20  Please schedule at anytime with [facility] if patient calls the office back.    40 Minutes appointment   Any questions, please call me at [phone]

## 2021-10-11 ENCOUNTER — Ambulatory Visit (INDEPENDENT_AMBULATORY_CARE_PROVIDER_SITE_OTHER): Payer: Medicare Other | Admitting: Internal Medicine

## 2021-10-11 ENCOUNTER — Encounter: Payer: Self-pay | Admitting: Internal Medicine

## 2021-10-11 ENCOUNTER — Other Ambulatory Visit: Payer: Self-pay

## 2021-10-11 DIAGNOSIS — M5441 Lumbago with sciatica, right side: Secondary | ICD-10-CM

## 2021-10-11 DIAGNOSIS — M545 Low back pain, unspecified: Secondary | ICD-10-CM | POA: Insufficient documentation

## 2021-10-11 MED ORDER — METHYLPREDNISOLONE 4 MG PO TBPK: ORAL_TABLET | ORAL | 0 refills | Status: DC

## 2021-10-11 MED ORDER — DIAZEPAM 5 MG PO TABS: 5.0000 mg | ORAL_TABLET | Freq: Two times a day (BID) | ORAL | 1 refills | Status: DC | PRN

## 2021-10-11 MED ORDER — HYDROCODONE-ACETAMINOPHEN 10-325 MG PO TABS: 1.0000 | ORAL_TABLET | Freq: Four times a day (QID) | ORAL | 0 refills | Status: AC | PRN

## 2021-10-11 NOTE — Patient Instructions (Addendum)
Spinal Stenosis Spinal stenosis is a condition that happens when the spinal canal narrows. The spinal canal is the space between the bones of your spine (vertebrae). This narrowing puts pressure on the spinal cord or nerves. Spinal stenosis can affect the vertebrae in the neck, upper back, and lower back. This condition can range from mild to severe. In some cases, there are no symptoms. What are the causes? This condition is caused by areas of bone pushing into the spinal canal. This condition may be present at birth (congenital), or it may be caused by: Slow breakdown of your vertebrae (spinal degeneration). This usually starts around 78 years of age. Injury (trauma) to your spine. Tumors in your spine. Calcium deposits in your spine. What increases the risk? The following factors may make you more likely to develop this condition: Being older than age 50. Having a problem present at birth with an abnormally shaped spine (congenitalspinal deformity), such as scoliosis. Having arthritis. What are the signs or symptoms? Symptoms of this condition include: Pain in the neck or back that is generally worse with activities, particularly when you stand or walk. Numbness, tingling, hot or cold sensations, weakness, or tiredness (fatigue) in your leg or legs. Pain going from the buttock, down the thigh, and to the calf (sciatica). This can happen in one or both legs. Frequent episodes of falling. A foot-slapping gait that leads to muscle weakness. In more severe cases, you may develop: Problems having a bowel movement or urinating. Difficulty having sex. Loss of feeling in your legs and inability to walk. Symptoms may come on slowly and get worse over time. In some cases, there are no symptoms. How is this diagnosed? This condition is diagnosed based on your medical history and a physical exam. You will also have tests, such as an MRI, a CT scan, or an X-ray. How is this treated? Treatment for  this condition often focuses on managing your pain and any other symptoms. Treatment may include: Practicing good posture to lessen pressure on your nerves. Exercising to strengthen muscles, build endurance, improve balance, and maintain range of motion. This may include physical therapy to restore movement and strength to your back. Losing weight, if needed. Medicines to reduce inflammation or pain. This may include a medicine that is injected into your spine (steroidinjection). Assistive devices, such as a corset or brace. In some cases, surgery may be needed. The most common procedure is decompression laminectomy. This is done to remove excess bone that puts pressure on your nerve roots. Follow these instructions at home: Managing pain, stiffness, and swelling  Practice good posture. If you were given a brace or a corset, wear it as told by your health care provider. Maintain a healthy weight. Talk with your health care provider if you need help losing weight. If directed, apply heat to the affected area as often as told by your health care provider. Use the heat source that your health care provider recommends, such as a moist heat pack or a heating pad. Place a towel between your skin and the heat source. Leave the heat on for 20-30 minutes. Remove the heat if your skin turns bright red. This is especially important if you are unable to feel pain, heat, or cold. You may have a greater risk of getting burned. Activity Do all exercises and stretches as told by your health care provider. Do not do any activities that cause pain. Ask your health care provider what activities are safe for you. Do   not lift anything that is heavier than 10 lb (4.5 kg), or the limit that you are told by your health care provider. Return to your normal activities as told by your health care provider. Ask your health care provider what activities are safe for you. General instructions Take over-the-counter and  prescription medicines only as told by your health care provider. Do not use any products that contain nicotine or tobacco, such as cigarettes, e-cigarettes, and chewing tobacco. If you need help quitting, ask your health care provider. Eat a healthy diet. This includes plenty of fruits and vegetables, whole grains, and low-fat (lean) protein. Keep all follow-up visits as told by your health care provider. This is important. Contact a health care provider if: Your symptoms do not get better or they get worse. You have a fever. Get help right away if: You have new pain or symptoms of severe pain, such as: New or worsening pain in your neck or upper back. Severe pain that cannot be controlled with medicines. A severe headache that gets worse when you stand. You are dizzy. You have vision problems, such as blurred vision or double vision. You have nausea or you vomit. You develop new or worsening numbness or tingling in your back or legs. You have pain, redness, swelling, or warmth in your arm or leg. Summary Spinal stenosis is a condition that happens when the spinal canal narrows. The spinal canal is the space between the bones of your spine (vertebrae). This narrowing puts pressure on the spinal cord or nerves. This condition may be caused by a birth defect, breakdown of your vertebrae, trauma, tumors, or calcium deposits. Spinal stenosis can cause numbness, weakness, or pain in the buttocks, neck, back, and legs. This condition is usually diagnosed with your medical history, a physical exam, and tests, such as an MRI, a CT scan, or an X-ray. This information is not intended to replace advice given to you by your health care provider. Make sure you discuss any questions you have with your health care provider. Document Revised: 06/26/2019 Document Reviewed: 06/26/2019 Elsevier Patient Education  2022 [employer] Inc[employer]  

## 2021-10-11 NOTE — Assessment & Plan Note (Addendum)
Not better Probable spinal stenosis - LBP - s/p MRI last Sat; appt w/Dr Rolena Infante is on 10/17/21 LBP 6-7/10, sometimes it is worse. LS w/pain R>>L Taking Aleve prn... Norco prn  Potential benefits of a short or long term opioids use as well as potential risks (i.e. addiction risk, apnea etc) and complications (i.e. Somnolence, constipation and others) were explained to the patient and were aknowledged. Diazepam prn muscle spasms  Potential benefits of a short or long term benzodiazepines  use as well as potential risks  and complications were explained to the patient and were aknowledged.  Medrol pack

## 2021-10-11 NOTE — Progress Notes (Signed)
Subjective:  Patient ID: John Rowe, male    DOB: 1946-08-04  Age: 76 y.o. MRN: [id number]  CC: Annual Exam   HPI John Rowe presents for LBP - s/p MRI last Sat; appt w/Dr Rolena Infante is on 10/17/21 LBP 6-7/10, sometimes it is worse. LS w/pain R>>L. RLE is getting weaker... Taking Aleve prn...   Outpatient Medications Prior to Visit  Medication Sig Dispense Refill   benazepril (LOTENSIN) 10 MG tablet Take 1 tablet (10 mg total) by mouth daily. 90 tablet 3   cholecalciferol (VITAMIN D) 25 MCG (1000 UNIT) tablet Take 1 tablet (1,000 Units total) by mouth daily. 30 tablet 11   Cyanocobalamin (VITAMIN B-12) 1000 MCG SUBL Place 1 tablet (1,000 mcg total) under the tongue daily. 100 tablet 3   gabapentin (NEURONTIN) 300 MG capsule TAKE 1 CAPSULE BY MOUTH THREE TIMES A DAY     pravastatin (PRAVACHOL) 20 MG tablet Take 1 tablet (20 mg total) by mouth daily. 30 tablet 11   No facility-administered medications prior to visit.    ROS: Review of Systems  Constitutional:  Negative for appetite change, fatigue and unexpected weight change.  HENT:  Negative for congestion, nosebleeds, sneezing, sore throat and trouble swallowing.   Eyes:  Negative for itching and visual disturbance.  Respiratory:  Negative for cough.   Cardiovascular:  Negative for chest pain, palpitations and leg swelling.  Gastrointestinal:  Negative for abdominal distention, blood in stool, diarrhea and nausea.  Genitourinary:  Negative for frequency and hematuria.  Musculoskeletal:  Positive for back pain and gait problem. Negative for joint swelling and neck pain.  Skin:  Negative for rash.  Neurological:  Negative for dizziness, tremors, speech difficulty and weakness.  Psychiatric/Behavioral:  Negative for agitation, dysphoric mood and sleep disturbance. The patient is not nervous/anxious.    Objective:  BP (!) 110/58 (BP Location: Left Arm)    Pulse 84    Temp 98.1 F (36.7 C) (Oral)    Ht 5\' 8"  (1.727 m)    Wt 191  lb 3.2 oz (86.7 kg)    SpO2 96%    BMI 29.07 kg/m   BP Readings from Last 3 Encounters:  10/11/21 (!) 110/58  04/05/21 118/62  10/07/20 122/70    Wt Readings from Last 3 Encounters:  10/11/21 191 lb 3.2 oz (86.7 kg)  04/05/21 196 lb 6.4 oz (89.1 kg)  10/07/20 197 lb 5 oz (89.5 kg)    Physical Exam Constitutional:      General: He is not in acute distress.    Appearance: He is well-developed.     Comments: NAD  Eyes:     Conjunctiva/sclera: Conjunctivae normal.     Pupils: Pupils are equal, round, and reactive to light.  Neck:     Thyroid: No thyromegaly.     Vascular: No JVD.  Cardiovascular:     Rate and Rhythm: Normal rate and regular rhythm.     Heart sounds: Normal heart sounds. No murmur heard.   No friction rub. No gallop.  Pulmonary:     Effort: Pulmonary effort is normal. No respiratory distress.     Breath sounds: Normal breath sounds. No wheezing or rales.  Chest:     Chest wall: No tenderness.  Abdominal:     General: Bowel sounds are normal. There is no distension.     Palpations: Abdomen is soft. There is no mass.     Tenderness: There is no abdominal tenderness. There is no guarding or rebound.  Musculoskeletal:        General: Tenderness present. Normal range of motion.     Cervical back: Normal range of motion.  Lymphadenopathy:     Cervical: No cervical adenopathy.  Skin:    General: Skin is warm and dry.     Findings: No rash.  Neurological:     Mental Status: He is alert and oriented to person, place, and time.     Cranial Nerves: No cranial nerve deficit.     Motor: No abnormal muscle tone.     Coordination: Coordination normal.     Gait: Gait abnormal.     Deep Tendon Reflexes: Reflexes are normal and symmetric.  Psychiatric:        Behavior: Behavior normal.        Thought Content: Thought content normal.        Judgment: Judgment normal.   LS w/pain R>>L Str leg elev (-) B DTRs, MS OK B   Lab Results  Component Value Date   WBC  8.9 10/07/2020   HGB 14.5 10/07/2020   HCT 42.2 10/07/2020   PLT 202.0 10/07/2020   GLUCOSE 118 (H) 04/05/2021   CHOL 176 10/07/2020   TRIG 103.0 10/07/2020   HDL 53.90 10/07/2020   LDLDIRECT 145.8 07/01/2010   LDLCALC 102 (H) 10/07/2020   ALT 14 04/05/2021   AST 14 04/05/2021   NA 136 04/05/2021   K 4.1 04/05/2021   CL 102 04/05/2021   CREATININE 1.28 04/05/2021   BUN 14 04/05/2021   CO2 27 04/05/2021   TSH 1.00 10/07/2020   PSA 0.16 10/07/2020   INR 1.0 RATIO 06/26/2006   HGBA1C 5.6 04/05/2021    US Renal  Result Date: 10/19/2020 CLINICAL DATA:  Decreased GFR EXAM: RENAL / URINARY TRACT ULTRASOUND COMPLETE COMPARISON:  None. FINDINGS: Right Kidney: Renal measurements: 12.2 x 5.4 x 5.3 cm. = volume: 183 mL. Echogenicity within normal limits. No mass or hydronephrosis visualized. Left Kidney: Renal measurements: 13.4 x 5.9 x 6.0 cm = volume: 248 mL. Some suggestion of mild cortical thinning is noted. No mass lesion or hydronephrosis is noted. Bladder: Appears normal for degree of bladder distention. Other: None. IMPRESSION: Suggestion of cortical thinning on the left although this may be related to the larger size of the left kidney. No other focal abnormality is noted. Electronically Signed   By: Inez Catalina M.D.   On: 10/19/2020 16:16    Assessment & Plan:   Problem List Items Addressed This Visit     Low back pain    Not better Probable spinal stenosis - LBP - s/p MRI last Sat; appt w/Dr Rolena Infante is on 10/17/21 LBP 6-7/10, sometimes it is worse. LS w/pain R>>L Taking Aleve prn... Norco prn  Potential benefits of a short or long term opioids use as well as potential risks (i.e. addiction risk, apnea etc) and complications (i.e. Somnolence, constipation and others) were explained to the patient and were aknowledged. Diazepam prn muscle spasms  Potential benefits of a short or long term benzodiazepines  use as well as potential risks  and complications were explained to the  patient and were aknowledged.  Medrol pack       Relevant Medications   HYDROcodone-acetaminophen (NORCO) 10-325 MG tablet   methylPREDNISolone (MEDROL DOSEPAK) 4 MG TBPK tablet      Meds ordered this encounter  Medications   HYDROcodone-acetaminophen (NORCO) 10-325 MG tablet    Sig: Take 1 tablet by mouth every 6 (six) hours as needed for  up to 5 days.    Dispense:  20 tablet    Refill:  0   methylPREDNISolone (MEDROL DOSEPAK) 4 MG TBPK tablet    Sig: As directed    Dispense:  21 tablet    Refill:  0   diazepam (VALIUM) 5 MG tablet    Sig: Take 1 tablet (5 mg total) by mouth every 12 (twelve) hours as needed for muscle spasms (back spasms).    Dispense:  30 tablet    Refill:  1      Follow-up: Return in about 6 weeks (around 11/22/2021).  Walker Kehr, MD

## 2021-10-14 ENCOUNTER — Ambulatory Visit (INDEPENDENT_AMBULATORY_CARE_PROVIDER_SITE_OTHER): Payer: Medicare Other

## 2021-10-14 ENCOUNTER — Other Ambulatory Visit: Payer: Self-pay

## 2021-10-14 DIAGNOSIS — Z0000 Encounter for general adult medical examination without abnormal findings: Secondary | ICD-10-CM | POA: Diagnosis not present

## 2021-10-14 NOTE — Patient Instructions (Signed)
John Rowe , Thank you for taking time to come for your Medicare Wellness Visit. I appreciate your ongoing commitment to your health goals. Please review the following plan we discussed and let me know if I can assist you in the future.   Screening recommendations/referrals: Colonoscopy: Not a candidate for screening due to age Recommended yearly ophthalmology/optometry visit for glaucoma screening and checkup Recommended yearly dental visit for hygiene and checkup  Vaccinations: Influenza vaccine: 10/07/2020; overdue Pneumococcal vaccine: 04/06/2010, 09/24/2014 Tdap vaccine: 10/07/2018; due every 10 years (due 10/07/2028) Shingles vaccine: Never done   Covid-19: 11/09/2019, 12/09/2019, 07/25/2020  Advanced directives: Advance directive discussed with you today. Even though you declined this today please call our office should you change your mind and we can give you the proper paperwork for you to fill out.  Conditions/risks identified: Yes; Client understands the importance of follow-up with providers by attending scheduled visits and discussed goals to eat healthier, increase physical activity, exercise the brain, socialize more, get enough sleep and make time for laughter.  Next appointment: 10/16/2022 at 9:20 am telephone visit with Lisette Abu, LPN.  If need to reschedule or cancel please call [phone].  Preventive Care 30 Years and Older, Male Preventive care refers to lifestyle choices and visits with your health care provider that can promote health and wellness. What does preventive care include? A yearly physical exam. This is also called an annual well check. Dental exams once or twice a year. Routine eye exams. Ask your health care provider how often you should have your eyes checked. Personal lifestyle choices, including: Daily care of your teeth and gums. Regular physical activity. Eating a healthy diet. Avoiding tobacco and drug use. Limiting alcohol use. Practicing  safe sex. Taking low doses of aspirin every day. Taking vitamin and mineral supplements as recommended by your health care provider. What happens during an annual well check? The services and screenings done by your health care provider during your annual well check will depend on your age, overall health, lifestyle risk factors, and family history of disease. Counseling  Your health care provider may ask you questions about your: Alcohol use. Tobacco use. Drug use. Emotional well-being. Home and relationship well-being. Sexual activity. Eating habits. History of falls. Memory and ability to understand (cognition). Work and work Statistician. Screening  You may have the following tests or measurements: Height, weight, and BMI. Blood pressure. Lipid and cholesterol levels. These may be checked every 5 years, or more frequently if you are over 58 years old. Skin check. Lung cancer screening. You may have this screening every year starting at age 72 if you have a 30-pack-year history of smoking and currently smoke or have quit within the past 15 years. Fecal occult blood test (FOBT) of the stool. You may have this test every year starting at age 60. Flexible sigmoidoscopy or colonoscopy. You may have a sigmoidoscopy every 5 years or a colonoscopy every 10 years starting at age 67. Prostate cancer screening. Recommendations will vary depending on your family history and other risks. Hepatitis C blood test. Hepatitis B blood test. Sexually transmitted disease (STD) testing. Diabetes screening. This is done by checking your blood sugar (glucose) after you have not eaten for a while (fasting). You may have this done every 1-3 years. Abdominal aortic aneurysm (AAA) screening. You may need this if you are a current or former smoker. Osteoporosis. You may be screened starting at age 63 if you are at high risk. Talk with your health care provider  about your test results, treatment options, and  if necessary, the need for more tests. Vaccines  Your health care provider may recommend certain vaccines, such as: Influenza vaccine. This is recommended every year. Tetanus, diphtheria, and acellular pertussis (Tdap, Td) vaccine. You may need a Td booster every 10 years. Zoster vaccine. You may need this after age 25. Pneumococcal 13-valent conjugate (PCV13) vaccine. One dose is recommended after age 59. Pneumococcal polysaccharide (PPSV23) vaccine. One dose is recommended after age 43. Talk to your health care provider about which screenings and vaccines you need and how often you need them. This information is not intended to replace advice given to you by your health care provider. Make sure you discuss any questions you have with your health care provider. Document Released: 09/24/2015 Document Revised: 05/17/2016 Document Reviewed: 06/29/2015 Elsevier Interactive Patient Education  2017 [place] Prevention in the Home Falls can cause injuries. They can happen to people of all ages. There are many things you can do to make your home safe and to help prevent falls. What can I do on the outside of my home? Regularly fix the edges of walkways and driveways and fix any cracks. Remove anything that might make you trip as you walk through a door, such as a raised step or threshold. Trim any bushes or trees on the path to your home. Use bright outdoor lighting. Clear any walking paths of anything that might make someone trip, such as rocks or tools. Regularly check to see if handrails are loose or broken. Make sure that both sides of any steps have handrails. Any raised decks and porches should have guardrails on the edges. Have any leaves, snow, or ice cleared regularly. Use sand or salt on walking paths during winter. Clean up any spills in your garage right away. This includes oil or grease spills. What can I do in the bathroom? Use night lights. Install grab bars by the  toilet and in the tub and shower. Do not use towel bars as grab bars. Use non-skid mats or decals in the tub or shower. If you need to sit down in the shower, use a plastic, non-slip stool. Keep the floor dry. Clean up any water that spills on the floor as soon as it happens. Remove soap buildup in the tub or shower regularly. Attach bath mats securely with double-sided non-slip rug tape. Do not have throw rugs and other things on the floor that can make you trip. What can I do in the bedroom? Use night lights. Make sure that you have a light by your bed that is easy to reach. Do not use any sheets or blankets that are too big for your bed. They should not hang down onto the floor. Have a firm chair that has side arms. You can use this for support while you get dressed. Do not have throw rugs and other things on the floor that can make you trip. What can I do in the kitchen? Clean up any spills right away. Avoid walking on wet floors. Keep items that you use a lot in easy-to-reach places. If you need to reach something above you, use a strong step stool that has a grab bar. Keep electrical cords out of the way. Do not use floor polish or wax that makes floors slippery. If you must use wax, use non-skid floor wax. Do not have throw rugs and other things on the floor that can make you trip. What can I do  with my stairs? Do not leave any items on the stairs. Make sure that there are handrails on both sides of the stairs and use them. Fix handrails that are broken or loose. Make sure that handrails are as long as the stairways. Check any carpeting to make sure that it is firmly attached to the stairs. Fix any carpet that is loose or worn. Avoid having throw rugs at the top or bottom of the stairs. If you do have throw rugs, attach them to the floor with carpet tape. Make sure that you have a light switch at the top of the stairs and the bottom of the stairs. If you do not have them, ask someone  to add them for you. What else can I do to help prevent falls? Wear shoes that: Do not have high heels. Have rubber bottoms. Are comfortable and fit you well. Are closed at the toe. Do not wear sandals. If you use a stepladder: Make sure that it is fully opened. Do not climb a closed stepladder. Make sure that both sides of the stepladder are locked into place. Ask someone to hold it for you, if possible. Clearly mark and make sure that you can see: Any grab bars or handrails. First and last steps. Where the edge of each step is. Use tools that help you move around (mobility aids) if they are needed. These include: Canes. Walkers. Scooters. Crutches. Turn on the lights when you go into a dark area. Replace any light bulbs as soon as they burn out. Set up your furniture so you have a clear path. Avoid moving your furniture around. If any of your floors are uneven, fix them. If there are any pets around you, be aware of where they are. Review your medicines with your doctor. Some medicines can make you feel dizzy. This can increase your chance of falling. Ask your doctor what other things that you can do to help prevent falls. This information is not intended to replace advice given to you by your health care provider. Make sure you discuss any questions you have with your health care provider. Document Released: 06/24/2009 Document Revised: 02/03/2016 Document Reviewed: 10/02/2014 Elsevier Interactive Patient Education  2017 [employer].

## 2021-10-14 NOTE — Progress Notes (Signed)
I connected with John Rowe today by telephone and verified that I am speaking with the correct person using two identifiers. Location patient: home Location provider: work Persons participating in the virtual visit: patient, provider.   I discussed the limitations, risks, security and privacy concerns of performing an evaluation and management service by telephone and the availability of in person appointments. I also discussed with the patient that there may be a patient responsible charge related to this service. The patient expressed understanding and verbally consented to this telephonic visit.    Interactive audio and video telecommunications were attempted between this provider and patient, however failed, due to patient having technical difficulties OR patient did not have access to video capability.  We continued and completed visit with audio only.  Some vital signs may be absent or patient reported.   Time Spent with patient on telephone encounter: 40 minutes  Subjective:   John Rowe is a 76 y.o. male who presents for Medicare Annual/Subsequent preventive examination.  Review of Systems     Cardiac Risk Factors include: advanced age (>76men, >59 women);dyslipidemia;hypertension;male gender     Objective:    There were no vitals filed for this visit. There is no height or weight on file to calculate BMI.  Advanced Directives 10/14/2021 04/02/2018 12/21/2017 10/11/2017  Does Patient Have a Medical Advance Directive? No No No No  Would patient like information on creating a medical advance directive? No - Patient declined No - Patient declined - -    Current Medications (verified) Outpatient Encounter Medications as of 10/14/2021  Medication Sig   benazepril (LOTENSIN) 10 MG tablet Take 1 tablet (10 mg total) by mouth daily.   cholecalciferol (VITAMIN D) 25 MCG (1000 UNIT) tablet Take 1 tablet (1,000 Units total) by mouth daily.   Cyanocobalamin (VITAMIN B-12) 1000 MCG  SUBL Place 1 tablet (1,000 mcg total) under the tongue daily.   diazepam (VALIUM) 5 MG tablet Take 1 tablet (5 mg total) by mouth every 12 (twelve) hours as needed for muscle spasms (back spasms).   gabapentin (NEURONTIN) 300 MG capsule TAKE 1 CAPSULE BY MOUTH THREE TIMES A DAY   HYDROcodone-acetaminophen (NORCO) 10-325 MG tablet Take 1 tablet by mouth every 6 (six) hours as needed for up to 5 days.   methylPREDNISolone (MEDROL DOSEPAK) 4 MG TBPK tablet As directed   pravastatin (PRAVACHOL) 20 MG tablet Take 1 tablet (20 mg total) by mouth daily.   No facility-administered encounter medications on file as of 10/14/2021.    Allergies (verified) Oxycodone-acetaminophen, Tetracycline hcl, and Erythromycin base   History: Past Medical History:  Diagnosis Date   Arthritis    Cataract    beginning stage  both eyes   Chest pain    Hearing loss of left ear    Hyperlipidemia    Hypertension    Meniere disease    Past Surgical History:  Procedure Laterality Date   COLONOSCOPY     ESOPHAGOGASTRODUODENOSCOPY (EGD) WITH PROPOFOL N/A 10/11/2017   Procedure: ESOPHAGOGASTRODUODENOSCOPY (EGD) WITH PROPOFOL;  Surgeon: Milus Banister, MD;  Location: [facility];  Service: Endoscopy;  Laterality: N/A;   POLYPECTOMY     UPPER GASTROINTESTINAL ENDOSCOPY     Family History  Adopted: Yes  Problem Relation Age of Onset   Other Father        Deceased, sepsis   Healthy Child    Other Grandchild        1p36 syndrome   Colon polyps Neg Hx  Colon cancer Neg Hx    Esophageal cancer Neg Hx    Stomach cancer Neg Hx    Rectal cancer Neg Hx    Social History   Socioeconomic History   Marital status: Married    Spouse name: Not on file   Number of children: 3   Years of education: Not on file   Highest education level: Not on file  Occupational History   Occupation: retired  Tobacco Use   Smoking status: Former    Types: Cigarettes    Quit date: 09/12/1979    Years since quitting: 42.1    Smokeless tobacco: Never  Vaping Use   Vaping Use: Never used  Substance and Sexual Activity   Alcohol use: Yes    Alcohol/week: 0.0 standard drinks    Comment: Drinks 3-4 beers nightly x 50 years   Drug use: No   Sexual activity: Yes  Other Topics Concern   Not on file  Social History Narrative   Lives with wife in a 4 story home.  Has 3 children and 7 grandkids.  Retired from [employer].     Education: some college.     Social Determinants of Health   Financial Resource Strain: Low Risk    Difficulty of Paying Living Expenses: Not hard at all  Food Insecurity: No Food Insecurity   Worried About Charity fundraiser in the Last Year: Never true   Bell in the Last Year: Never true  Transportation Needs: No Transportation Needs   Lack of Transportation (Medical): No   Lack of Transportation (Non-Medical): No  Physical Activity: Sufficiently Active   Days of Exercise per Week: 5 days   Minutes of Exercise per Session: 30 min  Stress: No Stress Concern Present   Feeling of Stress : Not at all  Social Connections: Socially Integrated   Frequency of Communication with Friends and Family: More than three times a week   Frequency of Social Gatherings with Friends and Family: More than three times a week   Attends Religious Services: More than 4 times per year   Active Member of [employer] or Organizations: Yes   Attends Music therapist: More than 4 times per year   Marital Status: Married    Tobacco Counseling Counseling given: Not Answered   Clinical Intake:  Pre-visit preparation completed: No  Pain : No/denies pain     Nutritional Risks: None Diabetes: No  How often do you need to have someone help you when you read instructions, pamphlets, or other written materials from your doctor or pharmacy?: 1 - Never What is the 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 no  Interpreter Needed?:  No  Information entered by :: Lisette Abu, LPN   Activities of Daily Living In your present state of health, do you have any difficulty performing the following activities: 10/14/2021  Hearing? Y  Vision? N  Difficulty concentrating or making decisions? Y  Walking or climbing stairs? N  Dressing or bathing? N  Doing errands, shopping? N  Preparing Food and eating ? N  Using the Toilet? N  In the past six months, have you accidently leaked urine? N  Do you have problems with loss of bowel control? N  Managing your Medications? N  Managing your Finances? N  Housekeeping or managing your Housekeeping? N  Some recent data might be hidden    Patient Care Team: Plotnikov, Evie Lacks, MD as  PCP - General (Internal Medicine) [facility] as Consulting Physician (Ophthalmology) Melina Schools, MD as Consulting Physician (Orthopedic Surgery)  Indicate any recent Medical Services you may have received from other than [facility] providers in the past year (date may be approximate).     Assessment:   This is a routine wellness examination for John Rowe.  Hearing/Vision screen Hearing Screening - Comments:: Patient has hearing difficulty. No hearing aids. Vision Screening - Comments:: Patient wears corrective glasses/contacts.  [facility] exam done annually by: [facility]  Dietary issues and exercise activities discussed: Current Exercise Habits: Home exercise routine, Type of exercise: walking;Other - see comments (golfing), Time (Minutes): 30, Frequency (Times/Week): 5, Weekly Exercise (Minutes/Week): 150, Intensity: Moderate, Exercise limited by: None identified   Goals Addressed               This Visit's Progress     Patient Stated (pt-stated)        Stay as healthy and as independent as possible. Continue to be active physically and socially. Enjoy life and family.      Depression Screen PHQ 2/9 Scores 10/14/2021 10/11/2021 10/07/2020 10/07/2020 04/07/2019 04/02/2018  04/02/2017  PHQ - 2 Score 0 0 0 0 0 0 0  PHQ- 9 Score 3 3 - - - - -    Fall Risk Fall Risk  10/14/2021 10/11/2021 10/07/2020 10/07/2020 04/06/2020  Falls in the past year? 1 1 0 0 0  Comment - pt states he was carrying something.. been having issues w/ back - - -  Number falls in past yr: 0 0 0 0 0  Injury with Fall? 0 0 0 0 0  Risk for fall due to : No Fall Risks No Fall Risks No Fall Risks - -  Follow up Falls evaluation completed - - - -    FALL RISK PREVENTION PERTAINING TO THE HOME:  Any stairs in or around the home? Yes  If so, are there any without handrails? No  Home free of loose throw rugs in walkways, pet beds, electrical cords, etc? Yes  Adequate lighting in your home to reduce risk of falls? Yes   ASSISTIVE DEVICES UTILIZED TO PREVENT FALLS:  Life alert? No  Use of a cane, walker or w/c? No  Grab bars in the bathroom? No  Shower chair or bench in shower? No  Elevated toilet seat or a handicapped toilet? No   TIMED UP AND GO:  Was the test performed? No .  Length of time to ambulate 10 feet: n/a sec.   Gait steady and fast without use of assistive device (per patient)  Cognitive Function: Normal cognitive status assessed by direct observation by this Nurse Health Advisor. No abnormalities found.          Immunizations Immunization History  Administered Date(s) Administered   Fluad Quad(high Dose 65+) 10/07/2020   Influenza, High Dose Seasonal PF 09/25/2016   Influenza,inj,Quad PF,6+ Mos 09/24/2014   PFIZER(Purple Top)SARS-COV-2 Vaccination 11/09/2019, 12/09/2019, 07/25/2020   Pneumococcal Conjugate-13 09/24/2014   Pneumococcal Polysaccharide-23 04/06/2010   Td 09/11/2005   Tdap 10/07/2018    TDAP status: Up to date  Flu Vaccine status: Due, Education has been provided regarding the importance of this vaccine. Advised may receive this vaccine at local pharmacy or Health Dept. Aware to provide a copy of the vaccination record if obtained from local pharmacy  or Health Dept. Verbalized acceptance and understanding.  Pneumococcal vaccine status: Up to date  Covid-19 vaccine status: Completed vaccines  Qualifies  for Shingles Vaccine? Yes   Zostavax completed No   Shingrix Completed?: No.    Education has been provided regarding the importance of this vaccine. Patient has been advised to call insurance company to determine out of pocket expense if they have not yet received this vaccine. Advised may also receive vaccine at local pharmacy or Health Dept. Verbalized acceptance and understanding.  Screening Tests Health Maintenance  Topic Date Due   Hepatitis C Screening  Never done   Zoster Vaccines- Shingrix (1 of 2) Never done   COVID-19 Vaccine (4 - Booster for Pfizer series) 09/19/2020   INFLUENZA VACCINE  04/11/2021   TETANUS/TDAP  10/07/2028   Pneumonia Vaccine 26+ Years old  Completed   HPV VACCINES  Aged Out   Fecal DNA (Cologuard)  Discontinued    Health Maintenance  Health Maintenance Due  Topic Date Due   Hepatitis C Screening  Never done   Zoster Vaccines- Shingrix (1 of 2) Never done   COVID-19 Vaccine (4 - Booster for Pfizer series) 09/19/2020   INFLUENZA VACCINE  04/11/2021    Colorectal cancer screening: No longer required.   Lung Cancer Screening: (Low Dose CT Chest recommended if Age 78-80 years, 30 pack-year currently smoking OR have quit w/in 15years.) does not qualify.   Lung Cancer Screening Referral: no  Additional Screening:  Hepatitis C Screening: does qualify; Completed no  Vision Screening: Recommended annual ophthalmology exams for early detection of glaucoma and other disorders of the eye. Is the patient up to date with their annual eye exam?  Yes  Who is the provider or what is the name of the office in which the patient attends annual eye exams? [facility] If pt is not established with a provider, would they like to be referred to a provider to establish care? No .   Dental Screening:  Recommended annual dental exams for proper oral hygiene  Community Resource Referral / Chronic Care Management: CRR required this visit?  No   CCM required this visit?  No      Plan:     I have personally reviewed and noted the following in the patients chart:   Medical and social history Use of alcohol, tobacco or illicit drugs  Current medications and supplements including opioid prescriptions. Patient is currently taking opioid prescriptions. Information provided to patient regarding non-opioid alternatives. Patient advised to discuss non-opioid treatment plan with their provider. Functional ability and status Nutritional status Physical activity Advanced directives List of other physicians Hospitalizations, surgeries, and ER visits in previous 12 months Vitals Screenings to include cognitive, depression, and falls Referrals and appointments  In addition, I have reviewed and discussed with patient certain preventive protocols, quality metrics, and best practice recommendations. A written personalized care plan for preventive services as well as general preventive health recommendations were provided to patient.     Sheral Flow, LPN   11/15/6438   Nurse Notes:  Patient is cogitatively intact. There were no vitals filed for this visit. There is no height or weight on file to calculate BMI.

## 2021-11-22 ENCOUNTER — Other Ambulatory Visit: Payer: Self-pay

## 2021-11-22 ENCOUNTER — Ambulatory Visit (INDEPENDENT_AMBULATORY_CARE_PROVIDER_SITE_OTHER): Payer: Medicare Other | Admitting: Internal Medicine

## 2021-11-22 ENCOUNTER — Encounter: Payer: Self-pay | Admitting: Internal Medicine

## 2021-11-22 VITALS — BP 110/62 | HR 70 | Temp 97.8°F | Ht 68.0 in | Wt 193.1 lb

## 2021-11-22 DIAGNOSIS — Z0000 Encounter for general adult medical examination without abnormal findings: Secondary | ICD-10-CM | POA: Diagnosis not present

## 2021-11-22 DIAGNOSIS — Z1211 Encounter for screening for malignant neoplasm of colon: Secondary | ICD-10-CM

## 2021-11-22 DIAGNOSIS — Z23 Encounter for immunization: Secondary | ICD-10-CM | POA: Diagnosis not present

## 2021-11-22 DIAGNOSIS — E538 Deficiency of other specified B group vitamins: Secondary | ICD-10-CM

## 2021-11-22 DIAGNOSIS — I2510 Atherosclerotic heart disease of native coronary artery without angina pectoris: Secondary | ICD-10-CM

## 2021-11-22 DIAGNOSIS — I2583 Coronary atherosclerosis due to lipid rich plaque: Secondary | ICD-10-CM

## 2021-11-22 DIAGNOSIS — M5441 Lumbago with sciatica, right side: Secondary | ICD-10-CM | POA: Diagnosis not present

## 2021-11-22 DIAGNOSIS — I700 Atherosclerosis of aorta: Secondary | ICD-10-CM | POA: Insufficient documentation

## 2021-11-22 LAB — LIPID PANEL
Cholesterol: 198 mg/dL (ref 0–200)
HDL: 62.7 mg/dL (ref >39.00)
LDL Cholesterol: 111 mg/dL — ABNORMAL HIGH (ref 0–99)
NonHDL: 134.96
Total CHOL/HDL Ratio: 3
Triglycerides: 121 mg/dL (ref 0.0–149.0)
VLDL: 24.2 mg/dL (ref 0.0–40.0)

## 2021-11-22 LAB — CBC WITH DIFFERENTIAL/PLATELET
Basophils Absolute: 0.1 10*3/uL (ref 0.0–0.1)
Basophils Relative: 0.6 % (ref 0.0–3.0)
Eosinophils Absolute: 0.1 10*3/uL (ref 0.0–0.7)
Eosinophils Relative: 1 % (ref 0.0–5.0)
HCT: 42.5 % (ref 39.0–52.0)
Hemoglobin: 14.4 g/dL (ref 13.0–17.0)
Lymphocytes Relative: 31.2 % (ref 12.0–46.0)
Lymphs Abs: 3.2 10*3/uL (ref 0.7–4.0)
MCHC: 33.9 g/dL (ref 30.0–36.0)
MCV: 98 fl (ref 78.0–100.0)
Monocytes Absolute: 1 10*3/uL (ref 0.1–1.0)
Monocytes Relative: 9.8 % (ref 3.0–12.0)
Neutro Abs: 5.8 10*3/uL (ref 1.4–7.7)
Neutrophils Relative %: 57.4 % (ref 43.0–77.0)
Platelets: 213 10*3/uL (ref 150.0–400.0)
RBC: 4.34 Mil/uL (ref 4.22–5.81)
RDW: 13.9 % (ref 11.5–15.5)
WBC: 10.2 10*3/uL (ref 4.0–10.5)

## 2021-11-22 LAB — COMPREHENSIVE METABOLIC PANEL
ALT: 15 U/L (ref 0–53)
AST: 15 U/L (ref 0–37)
Albumin: 4.4 g/dL (ref 3.5–5.2)
Alkaline Phosphatase: 56 U/L (ref 39–117)
BUN: 14 mg/dL (ref 6–23)
CO2: 29 mEq/L (ref 19–32)
Calcium: 9.7 mg/dL (ref 8.4–10.5)
Chloride: 100 mEq/L (ref 96–112)
Creatinine, Ser: 1.16 mg/dL (ref 0.40–1.50)
GFR: 60.48 mL/min (ref >60.00)
Glucose, Bld: 97 mg/dL (ref 70–99)
Potassium: 4.3 mEq/L (ref 3.5–5.1)
Sodium: 136 mEq/L (ref 135–145)
Total Bilirubin: 0.9 mg/dL (ref 0.2–1.2)
Total Protein: 6.6 g/dL (ref 6.0–8.3)

## 2021-11-22 LAB — URINALYSIS
Bilirubin Urine: NEGATIVE
Hgb urine dipstick: NEGATIVE
Ketones, ur: NEGATIVE
Leukocytes,Ua: NEGATIVE
Nitrite: NEGATIVE
Specific Gravity, Urine: <=1.005 — AB (ref 1.000–1.030)
Total Protein, Urine: NEGATIVE
Urine Glucose: NEGATIVE
Urobilinogen, UA: 0.2 (ref 0.0–1.0)
pH: 6.5 (ref 5.0–8.0)

## 2021-11-22 LAB — TSH: TSH: 1.03 u[IU]/mL (ref 0.35–5.50)

## 2021-11-22 LAB — PSA: PSA: 0.12 ng/mL (ref 0.10–4.00)

## 2021-11-22 LAB — HEMOGLOBIN A1C: Hgb A1c MFr Bld: 5.7 % (ref 4.6–6.5)

## 2021-11-22 NOTE — Assessment & Plan Note (Signed)
On B12 

## 2021-11-22 NOTE — Assessment & Plan Note (Addendum)
?  We discussed age appropriate health related issues, including available/recomended screening tests and vaccinations. Labs were ordered to be later reviewed . All questions were answered. We discussed one or more of the following - seat belt use, use of sunscreen/sun exposure exercise, fall risk reduction, second hand smoke exposure, firearm use and storage, seat belt use, a need for adhering to healthy diet and exercise. ?Labs were ordered.  All questions were answered. ? ?Cor calcium CT IMPRESSION 2020: ?Coronary calcium score of 10 . This was 12 percentile for age and ?sex matched control. ? ?Cologuard (-) 2019 ?

## 2021-11-22 NOTE — Assessment & Plan Note (Signed)
Cor calcium CT IMPRESSION 2020: ?Coronary calcium score of 10 . This was 12 percentile for age and ?sex matched control. ?OnPravastatin ?

## 2021-11-22 NOTE — Progress Notes (Signed)
? ?Subjective:  ?Patient ID: John Rowe, male    DOB: 1946-05-29  Age: 76 y.o. MRN: [id number] ? ?CC: Follow-up (No concerns) ? ? ?HPI ?John Rowe presents for a well exam ?F/u on spinal stenosis ? ?Outpatient Medications Prior to Visit  ?Medication Sig Dispense Refill  ? benazepril (LOTENSIN) 10 MG tablet Take 1 tablet (10 mg total) by mouth daily. 90 tablet 3  ? cholecalciferol (VITAMIN D) 25 MCG (1000 UNIT) tablet Take 1 tablet (1,000 Units total) by mouth daily. 30 tablet 11  ? Cyanocobalamin (VITAMIN B-12) 1000 MCG SUBL Place 1 tablet (1,000 mcg total) under the tongue daily. 100 tablet 3  ? pravastatin (PRAVACHOL) 20 MG tablet Take 1 tablet (20 mg total) by mouth daily. 30 tablet 11  ? diazepam (VALIUM) 5 MG tablet Take 1 tablet (5 mg total) by mouth every 12 (twelve) hours as needed for muscle spasms (back spasms). (Patient not taking: Reported on 11/22/2021) 30 tablet 1  ? gabapentin (NEURONTIN) 300 MG capsule TAKE 1 CAPSULE BY MOUTH THREE TIMES A DAY (Patient not taking: Reported on 11/22/2021)    ? methylPREDNISolone (MEDROL DOSEPAK) 4 MG TBPK tablet As directed (Patient not taking: Reported on 11/22/2021) 21 tablet 0  ? ?No facility-administered medications prior to visit.  ? ? ?ROS: ?Review of Systems  ?Constitutional:  Negative for appetite change, fatigue and unexpected weight change.  ?HENT:  Negative for congestion, nosebleeds, sneezing, sore throat and trouble swallowing.   ?Eyes:  Negative for itching and visual disturbance.  ?Respiratory:  Negative for cough.   ?Cardiovascular:  Negative for chest pain, palpitations and leg swelling.  ?Gastrointestinal:  Negative for abdominal distention, blood in stool, diarrhea and nausea.  ?Genitourinary:  Negative for frequency and hematuria.  ?Musculoskeletal:  Negative for back pain, gait problem, joint swelling and neck pain.  ?Skin:  Negative for rash.  ?Neurological:  Negative for dizziness, tremors, speech difficulty and weakness.   ?Psychiatric/Behavioral:  Negative for agitation, dysphoric mood and sleep disturbance. The patient is not nervous/anxious.   ? ?Objective:  ?BP 110/62   Pulse 70   Temp 97.8 ?F (36.6 ?C) (Oral)   Ht '5\' 8"'$  (1.727 m)   Wt 193 lb 2 oz (87.6 kg)   SpO2 97%   BMI 29.36 kg/m?  ? ?BP Readings from Last 3 Encounters:  ?11/22/21 110/62  ?10/11/21 (!) 110/58  ?04/05/21 118/62  ? ? ?Wt Readings from Last 3 Encounters:  ?11/22/21 193 lb 2 oz (87.6 kg)  ?10/11/21 191 lb 3.2 oz (86.7 kg)  ?04/05/21 196 lb 6.4 oz (89.1 kg)  ? ? ?Physical Exam ?Constitutional:   ?   General: He is not in acute distress. ?   Appearance: He is well-developed.  ?   Comments: NAD  ?Eyes:  ?   Conjunctiva/sclera: Conjunctivae normal.  ?   Pupils: Pupils are equal, round, and reactive to light.  ?Neck:  ?   Thyroid: No thyromegaly.  ?   Vascular: No JVD.  ?Cardiovascular:  ?   Rate and Rhythm: Normal rate and regular rhythm.  ?   Heart sounds: Normal heart sounds. No murmur heard. ?  No friction rub. No gallop.  ?Pulmonary:  ?   Effort: Pulmonary effort is normal. No respiratory distress.  ?   Breath sounds: Normal breath sounds. No wheezing or rales.  ?Chest:  ?   Chest wall: No tenderness.  ?Abdominal:  ?   General: Bowel sounds are normal. There is no distension.  ?  Palpations: Abdomen is soft. There is no mass.  ?   Tenderness: There is no abdominal tenderness. There is no guarding or rebound.  ?Musculoskeletal:     ?   General: No tenderness. Normal range of motion.  ?   Cervical back: Normal range of motion.  ?Lymphadenopathy:  ?   Cervical: No cervical adenopathy.  ?Skin: ?   General: Skin is warm and dry.  ?   Findings: No rash.  ?Neurological:  ?   Mental Status: He is alert and oriented to person, place, and time.  ?   Cranial Nerves: No cranial nerve deficit.  ?   Motor: No abnormal muscle tone.  ?   Coordination: Coordination normal.  ?   Gait: Gait normal.  ?   Deep Tendon Reflexes: Reflexes are normal and symmetric.  ?Psychiatric:      ?   Behavior: Behavior normal.     ?   Thought Content: Thought content normal.     ?   Judgment: Judgment normal.  ? ? ?Lab Results  ?Component Value Date  ? WBC 8.9 10/07/2020  ? HGB 14.5 10/07/2020  ? HCT 42.2 10/07/2020  ? PLT 202.0 10/07/2020  ? GLUCOSE 118 (H) 04/05/2021  ? CHOL 176 10/07/2020  ? TRIG 103.0 10/07/2020  ? HDL 53.90 10/07/2020  ? LDLDIRECT 145.8 07/01/2010  ? LDLCALC 102 (H) 10/07/2020  ? ALT 14 04/05/2021  ? AST 14 04/05/2021  ? NA 136 04/05/2021  ? K 4.1 04/05/2021  ? CL 102 04/05/2021  ? CREATININE 1.28 04/05/2021  ? BUN 14 04/05/2021  ? CO2 27 04/05/2021  ? TSH 1.00 10/07/2020  ? PSA 0.16 10/07/2020  ? INR 1.0 RATIO 06/26/2006  ? HGBA1C 5.6 04/05/2021  ? ? ?US Renal ? ?Result Date: 10/19/2020 ?CLINICAL DATA:  Decreased GFR EXAM: RENAL / URINARY TRACT ULTRASOUND COMPLETE COMPARISON:  None. FINDINGS: Right Kidney: Renal measurements: 12.2 x 5.4 x 5.3 cm. = volume: 183 mL. Echogenicity within normal limits. No mass or hydronephrosis visualized. Left Kidney: Renal measurements: 13.4 x 5.9 x 6.0 cm = volume: 248 mL. Some suggestion of mild cortical thinning is noted. No mass lesion or hydronephrosis is noted. Bladder: Appears normal for degree of bladder distention. Other: None. IMPRESSION: Suggestion of cortical thinning on the left although this may be related to the larger size of the left kidney. No other focal abnormality is noted. Electronically Signed   By: Inez Catalina M.D.   On: 10/19/2020 16:16  ? ? ?Assessment & Plan:  ? ?Problem List Items Addressed This Visit   ? ? Aortic atherosclerosis ([place])  ?  Cor calcium CT IMPRESSION 2020: ?Coronary calcium score of 10 . This was 12 percentile for age and ?sex matched control. ?OnPravastatin ?  ?  ? Coronary atherosclerosis  ?  Cor calcium CT IMPRESSION 2020: ?Coronary calcium score of 10 . This was 12 percentile for age and ?sex matched control. ?OnPravastatin ?  ?  ? Low back pain  ?  Pain has resolved in Feb 2023 ?  ?  ? Vitamin B12  deficiency  ?  On B12 ?  ?  ? Well adult exam - Primary  ?   ?We discussed age appropriate health related issues, including available/recomended screening tests and vaccinations. Labs were ordered to be later reviewed . All questions were answered. We discussed one or more of the following - seat belt use, use of sunscreen/sun exposure exercise, fall risk reduction, second hand smoke exposure, firearm use  and storage, seat belt use, a need for adhering to healthy diet and exercise. ?Labs were ordered.  All questions were answered. ? ?Cor calcium CT IMPRESSION 2020: ?Coronary calcium score of 10 . This was 12 percentile for age and ?sex matched control. ? ?Cologuard (-) 2019 ?  ?  ? Relevant Orders  ? TSH  ? Urinalysis  ? CBC with Differential/Platelet  ? Lipid panel  ? PSA  ? Comprehensive metabolic panel  ? Hemoglobin A1c  ? Cologuard  ? ?Other Visit Diagnoses   ? ? Colon cancer screening      ? Relevant Orders  ? Cologuard  ? ?  ?  ? ? ?No orders of the defined types were placed in this encounter. ?  ? ? ?Follow-up: Return in about 6 months (around 05/25/2022) for a follow-up visit. ? ?Walker Kehr, MD ?

## 2021-11-22 NOTE — Assessment & Plan Note (Signed)
Pain has resolved in Feb 2023 ?

## 2022-01-17 ENCOUNTER — Telehealth: Payer: Self-pay

## 2022-01-17 DIAGNOSIS — Z1211 Encounter for screening for malignant neoplasm of colon: Secondary | ICD-10-CM

## 2022-01-17 NOTE — Telephone Encounter (Signed)
Pt calling to report that he hasn't received the Cologuard kit yet and was seeing if Dr. Alain Marion ordered it. ? ?I advised the pt that the order was put in and provided him with the customer service number to [facility]: [phone] ? ?FYI ?

## 2022-01-19 ENCOUNTER — Encounter: Payer: Self-pay | Admitting: Internal Medicine

## 2022-01-19 NOTE — Telephone Encounter (Signed)
Pt sent another mychart about cologuard.. MD order place back in March. Was given Chief Strategy Officer and placed order again.Marland KitchenJohny Chess ?

## 2022-03-22 ENCOUNTER — Encounter: Payer: Self-pay | Admitting: Internal Medicine

## 2022-03-22 DIAGNOSIS — Z1211 Encounter for screening for malignant neoplasm of colon: Secondary | ICD-10-CM

## 2022-03-23 NOTE — Telephone Encounter (Signed)
Orders has been placed twice.. Msg was sent to supervisor informing the pt is not being contacted for cologuard

## 2022-03-29 ENCOUNTER — Encounter: Payer: Self-pay | Admitting: Internal Medicine

## 2022-04-02 ENCOUNTER — Other Ambulatory Visit: Payer: Self-pay | Admitting: Internal Medicine

## 2022-04-05 LAB — COLOGUARD: COLOGUARD: NEGATIVE

## 2022-05-01 ENCOUNTER — Other Ambulatory Visit: Payer: Self-pay | Admitting: Internal Medicine

## 2022-05-25 ENCOUNTER — Encounter: Payer: Self-pay | Admitting: Internal Medicine

## 2022-05-25 ENCOUNTER — Ambulatory Visit (INDEPENDENT_AMBULATORY_CARE_PROVIDER_SITE_OTHER): Payer: Medicare Other | Admitting: Internal Medicine

## 2022-05-25 ENCOUNTER — Ambulatory Visit (INDEPENDENT_AMBULATORY_CARE_PROVIDER_SITE_OTHER): Payer: Medicare Other

## 2022-05-25 VITALS — BP 110/68 | HR 64 | Temp 98.1°F | Ht 68.0 in | Wt 195.6 lb

## 2022-05-25 DIAGNOSIS — E538 Deficiency of other specified B group vitamins: Secondary | ICD-10-CM | POA: Diagnosis not present

## 2022-05-25 DIAGNOSIS — Z23 Encounter for immunization: Secondary | ICD-10-CM

## 2022-05-25 DIAGNOSIS — I10 Essential (primary) hypertension: Secondary | ICD-10-CM

## 2022-05-25 DIAGNOSIS — R6883 Chills (without fever): Secondary | ICD-10-CM | POA: Diagnosis not present

## 2022-05-25 DIAGNOSIS — E785 Hyperlipidemia, unspecified: Secondary | ICD-10-CM

## 2022-05-25 DIAGNOSIS — M5441 Lumbago with sciatica, right side: Secondary | ICD-10-CM | POA: Diagnosis not present

## 2022-05-25 LAB — URINALYSIS
Bilirubin Urine: NEGATIVE
Hgb urine dipstick: NEGATIVE
Ketones, ur: NEGATIVE
Leukocytes,Ua: NEGATIVE
Nitrite: NEGATIVE
Specific Gravity, Urine: 1.015 (ref 1.000–1.030)
Total Protein, Urine: NEGATIVE
Urine Glucose: NEGATIVE
Urobilinogen, UA: 0.2 (ref 0.0–1.0)
pH: 6 (ref 5.0–8.0)

## 2022-05-25 LAB — CBC WITH DIFFERENTIAL/PLATELET
Basophils Absolute: 0 10*3/uL (ref 0.0–0.1)
Basophils Relative: 0.4 % (ref 0.0–3.0)
Eosinophils Absolute: 0.1 10*3/uL (ref 0.0–0.7)
Eosinophils Relative: 1.4 % (ref 0.0–5.0)
HCT: 42.7 % (ref 39.0–52.0)
Hemoglobin: 14.4 g/dL (ref 13.0–17.0)
Lymphocytes Relative: 29.9 % (ref 12.0–46.0)
Lymphs Abs: 2.9 10*3/uL (ref 0.7–4.0)
MCHC: 33.7 g/dL (ref 30.0–36.0)
MCV: 97.4 fl (ref 78.0–100.0)
Monocytes Absolute: 1.1 10*3/uL — ABNORMAL HIGH (ref 0.1–1.0)
Monocytes Relative: 11.2 % (ref 3.0–12.0)
Neutro Abs: 5.6 10*3/uL (ref 1.4–7.7)
Neutrophils Relative %: 57.1 % (ref 43.0–77.0)
Platelets: 212 10*3/uL (ref 150.0–400.0)
RBC: 4.38 Mil/uL (ref 4.22–5.81)
RDW: 13.9 % (ref 11.5–15.5)
WBC: 9.7 10*3/uL (ref 4.0–10.5)

## 2022-05-25 LAB — COMPREHENSIVE METABOLIC PANEL
ALT: 20 U/L (ref 0–53)
AST: 20 U/L (ref 0–37)
Albumin: 3.9 g/dL (ref 3.5–5.2)
Alkaline Phosphatase: 61 U/L (ref 39–117)
BUN: 12 mg/dL (ref 6–23)
CO2: 30 mEq/L (ref 19–32)
Calcium: 9.3 mg/dL (ref 8.4–10.5)
Chloride: 102 mEq/L (ref 96–112)
Creatinine, Ser: 1.24 mg/dL (ref 0.40–1.50)
GFR: 55.63 mL/min — ABNORMAL LOW (ref >60.00)
Glucose, Bld: 101 mg/dL — ABNORMAL HIGH (ref 70–99)
Potassium: 4.1 mEq/L (ref 3.5–5.1)
Sodium: 138 mEq/L (ref 135–145)
Total Bilirubin: 0.8 mg/dL (ref 0.2–1.2)
Total Protein: 7 g/dL (ref 6.0–8.3)

## 2022-05-25 LAB — SEDIMENTATION RATE: Sed Rate: 12 mm/hr (ref 0–20)

## 2022-05-25 NOTE — Progress Notes (Signed)
Subjective:  Patient ID: John Rowe, male    DOB: [birth year]  Age: 76 y.o. MRN: [id number]  CC: Follow-up (6 MONTH F/U- Flu shot)   HPI John Rowe presents for HTN, dyslipidemia, LBP, chronoc DOE  Outpatient Medications Prior to Visit  Medication Sig Dispense Refill   benazepril (LOTENSIN) 20 MG tablet TAKE 1/2 TABLET BY MOUTH DAILY 15 tablet 5   cholecalciferol (VITAMIN D) 25 MCG (1000 UNIT) tablet TAKE 1 TABLET BY MOUTH EVERY DAY 90 tablet 3   Cyanocobalamin (VITAMIN B-12) 1000 MCG SUBL Place 1 tablet (1,000 mcg total) under the tongue daily. 100 tablet 3   pravastatin (PRAVACHOL) 20 MG tablet TAKE 1 TABLET BY MOUTH EVERY DAY 30 tablet 5   benazepril (LOTENSIN) 10 MG tablet Take 1 tablet (10 mg total) by mouth daily. (Patient not taking: Reported on 05/25/2022) 90 tablet 3   diazepam (VALIUM) 5 MG tablet Take 1 tablet (5 mg total) by mouth every 12 (twelve) hours as needed for muscle spasms (back spasms). (Patient not taking: Reported on 11/22/2021) 30 tablet 1   gabapentin (NEURONTIN) 300 MG capsule TAKE 1 CAPSULE BY MOUTH THREE TIMES A DAY (Patient not taking: Reported on 11/22/2021)     No facility-administered medications prior to visit.    ROS: Review of Systems  Constitutional:  Negative for appetite change, fatigue and unexpected weight change.  HENT:  Negative for congestion, nosebleeds, sneezing, sore throat and trouble swallowing.   Eyes:  Negative for itching and visual disturbance.  Respiratory:  Negative for cough.   Cardiovascular:  Negative for chest pain, palpitations and leg swelling.  Gastrointestinal:  Negative for abdominal distention, blood in stool, diarrhea and nausea.  Genitourinary:  Negative for frequency and hematuria.  Musculoskeletal:  Positive for back pain. Negative for gait problem, joint swelling and neck pain.  Skin:  Negative for rash.  Neurological:  Negative for dizziness, tremors, speech difficulty and weakness.  Psychiatric/Behavioral:   Negative for agitation, dysphoric mood and sleep disturbance. The patient is not nervous/anxious.     Objective:  BP 110/68 (BP Location: Left Arm)   Pulse 64   Temp 98.1 F (36.7 C) (Oral)   Ht '5\' 8"'$  (1.727 m)   Wt 195 lb 9.6 oz (88.7 kg)   SpO2 97%   BMI 29.74 kg/m   BP Readings from Last 3 Encounters:  05/25/22 110/68  11/22/21 110/62  10/11/21 (!) 110/58    Wt Readings from Last 3 Encounters:  05/25/22 195 lb 9.6 oz (88.7 kg)  11/22/21 193 lb 2 oz (87.6 kg)  10/11/21 191 lb 3.2 oz (86.7 kg)    Physical Exam Constitutional:      General: He is not in acute distress.    Appearance: Normal appearance. He is well-developed.     Comments: NAD  Eyes:     Conjunctiva/sclera: Conjunctivae normal.     Pupils: Pupils are equal, round, and reactive to light.  Neck:     Thyroid: No thyromegaly.     Vascular: No JVD.  Cardiovascular:     Rate and Rhythm: Normal rate and regular rhythm.     Heart sounds: Normal heart sounds. No murmur heard.    No friction rub. No gallop.  Pulmonary:     Effort: Pulmonary effort is normal. No respiratory distress.     Breath sounds: Normal breath sounds. No wheezing or rales.  Chest:     Chest wall: No tenderness.  Abdominal:     General: Bowel sounds  are normal. There is no distension.     Palpations: Abdomen is soft. There is no mass.     Tenderness: There is no abdominal tenderness. There is no guarding or rebound.  Musculoskeletal:        General: No tenderness. Normal range of motion.     Cervical back: Normal range of motion.  Lymphadenopathy:     Cervical: No cervical adenopathy.  Skin:    General: Skin is warm and dry.     Findings: No rash.  Neurological:     Mental Status: He is alert and oriented to person, place, and time.     Cranial Nerves: No cranial nerve deficit.     Motor: No abnormal muscle tone.     Coordination: Coordination normal.     Gait: Gait normal.     Deep Tendon Reflexes: Reflexes are normal and  symmetric.  Psychiatric:        Behavior: Behavior normal.        Thought Content: Thought content normal.        Judgment: Judgment normal.     Lab Results  Component Value Date   WBC 9.7 05/25/2022   HGB 14.4 05/25/2022   HCT 42.7 05/25/2022   PLT 212.0 05/25/2022   GLUCOSE 101 (H) 05/25/2022   CHOL 198 11/22/2021   TRIG 121.0 11/22/2021   HDL 62.70 11/22/2021   LDLDIRECT 145.8 07/01/2010   LDLCALC 111 (H) 11/22/2021   ALT 20 05/25/2022   AST 20 05/25/2022   NA 138 05/25/2022   K 4.1 05/25/2022   CL 102 05/25/2022   CREATININE 1.24 05/25/2022   BUN 12 05/25/2022   CO2 30 05/25/2022   TSH 1.03 11/22/2021   PSA 0.12 11/22/2021   INR 1.0 RATIO 06/26/2006   HGBA1C 5.7 11/22/2021    US Renal  Result Date: 10/19/2020 CLINICAL DATA:  Decreased GFR EXAM: RENAL / URINARY TRACT ULTRASOUND COMPLETE COMPARISON:  None. FINDINGS: Right Kidney: Renal measurements: 12.2 x 5.4 x 5.3 cm. = volume: 183 mL. Echogenicity within normal limits. No mass or hydronephrosis visualized. Left Kidney: Renal measurements: 13.4 x 5.9 x 6.0 cm = volume: 248 mL. Some suggestion of mild cortical thinning is noted. No mass lesion or hydronephrosis is noted. Bladder: Appears normal for degree of bladder distention. Other: None. IMPRESSION: Suggestion of cortical thinning on the left although this may be related to the larger size of the left kidney. No other focal abnormality is noted. Electronically Signed   By: Inez Catalina M.D.   On: 10/19/2020 16:16    Assessment & Plan:   Problem List Items Addressed This Visit     Chill - Primary    Recurrent unusual episodes - legs up to the head q 2 wks or so. No LOC CXR, carotid US, Card consult, ECHO, labs offered - pt declined      Relevant Orders   DG Chest 2 View (Completed)   Comprehensive metabolic panel (Completed)   CBC with Differential/Platelet (Completed)   Urinalysis (Completed)   Sedimentation rate (Completed)   Dyslipidemia    Continue on  pravastatin      Essential hypertension    Continue on Lotensin      Low back pain    Recurrent LBP Diazepam prn muscle spasms      Vitamin B12 deficiency    Continue on vitamin B12      Other Visit Diagnoses     Needs flu shot       Relevant  Orders   Flu Vaccine QUAD High Dose(Fluad) (Completed)         No orders of the defined types were placed in this encounter.     Follow-up: Return in about 6 months (around 11/23/2022) for Wellness Exam.  Walker Kehr, MD

## 2022-05-25 NOTE — Assessment & Plan Note (Signed)
Recurrent LBP Diazepam prn muscle spasms

## 2022-05-25 NOTE — Assessment & Plan Note (Signed)
Recurrent unusual episodes - legs up to the head q 2 wks or so. No LOC CXR, carotid US, Card consult, ECHO, labs offered - pt declined

## 2022-06-11 NOTE — Assessment & Plan Note (Signed)
Continue on pravastatin

## 2022-06-11 NOTE — Assessment & Plan Note (Signed)
Continue on vitamin B12 

## 2022-06-11 NOTE — Assessment & Plan Note (Signed)
Continue on Lotensin

## 2022-10-04 ENCOUNTER — Other Ambulatory Visit: Payer: Self-pay | Admitting: Internal Medicine

## 2022-10-04 IMAGING — US US RENAL
1 series · 14 of 25 positions shown · non-contrast
Comparison: None.

CLINICAL DATA: Decreased GFR

EXAM:
RENAL / URINARY TRACT ULTRASOUND COMPLETE

[Series 1: us renal · 0.25mm/px · 14 of 35 slices shown]
[im 1/35]
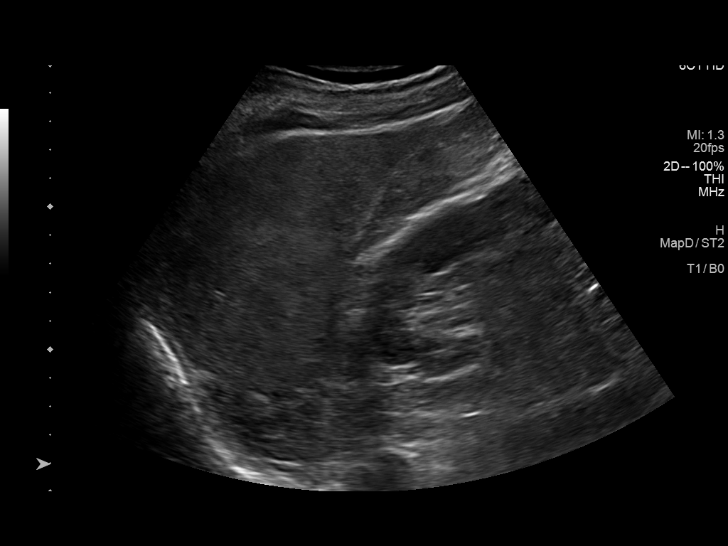
[im 3/35]
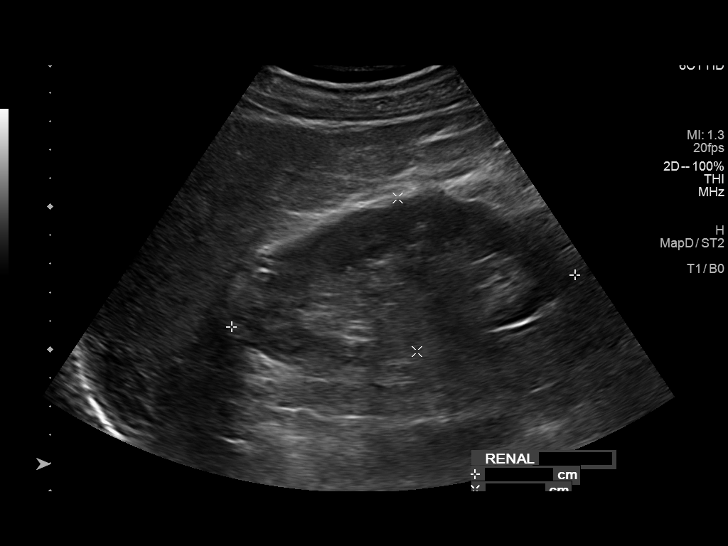
[im 6/35]
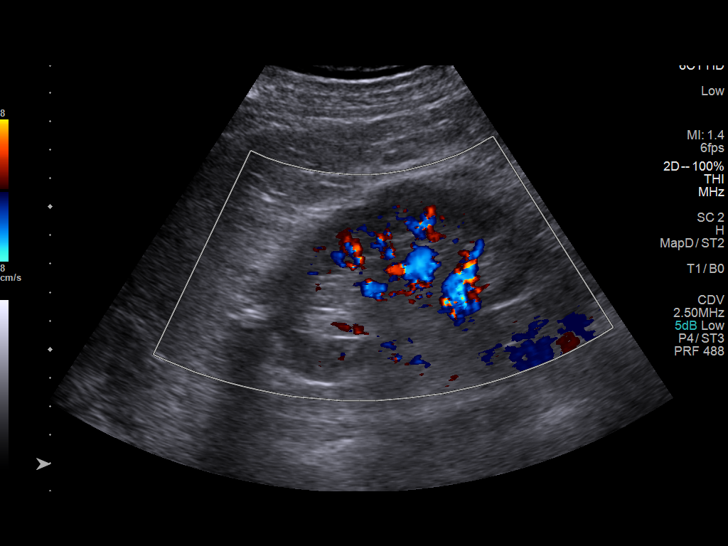
[im 9/35]
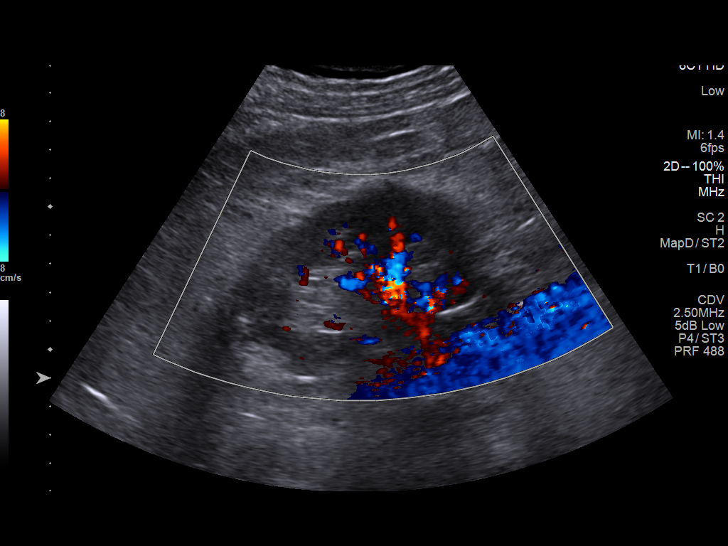
[im 12/35]
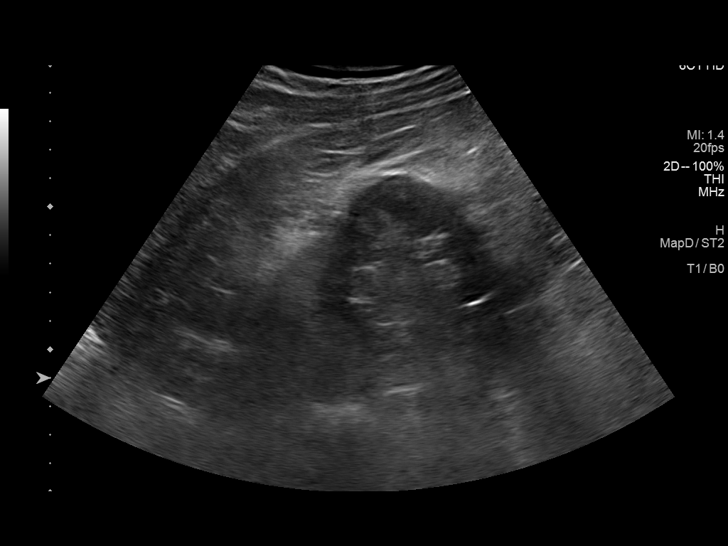
[im 13/35]
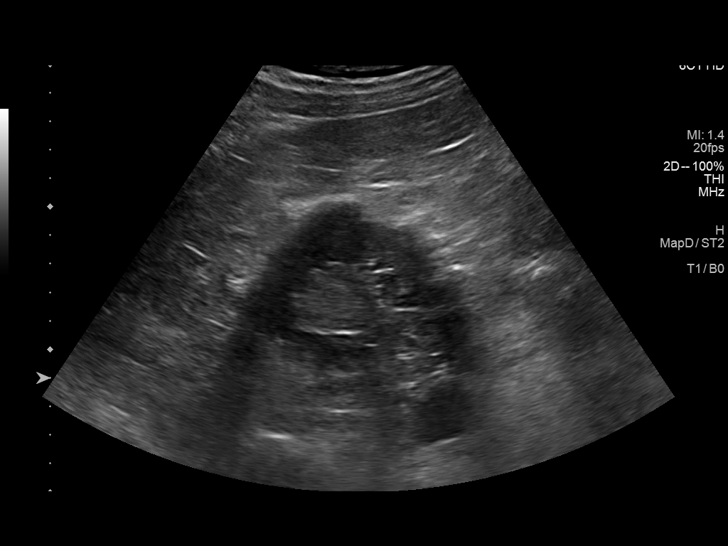
[im 16/35]
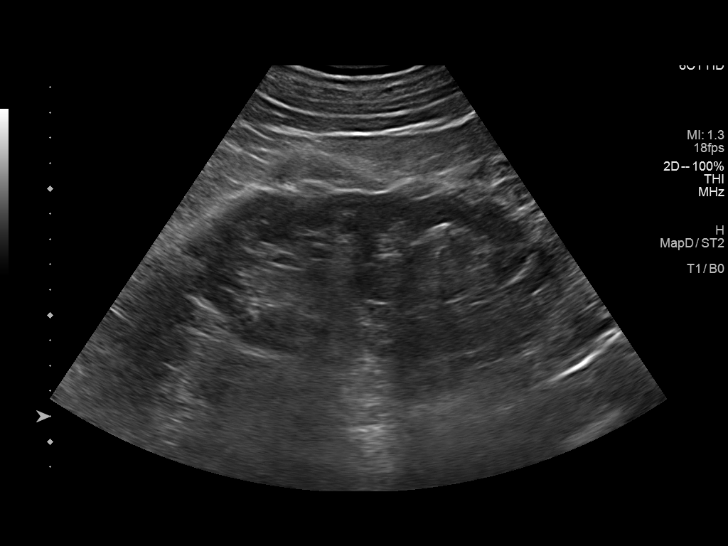
[im 19/35]
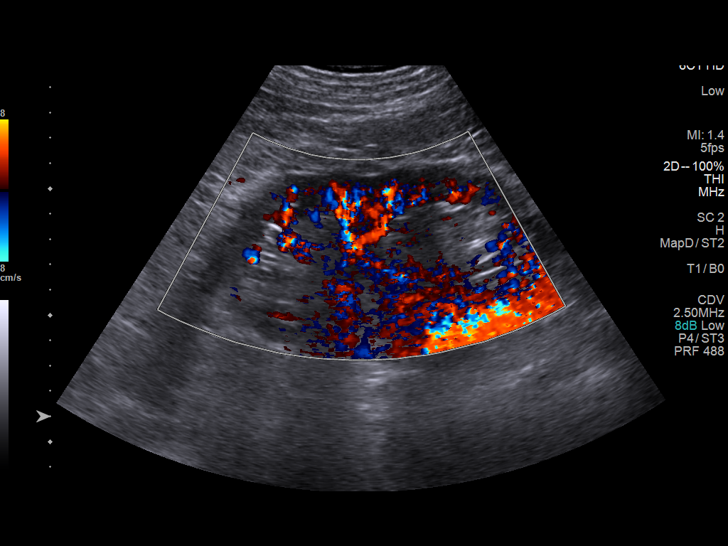
[im 22/35]
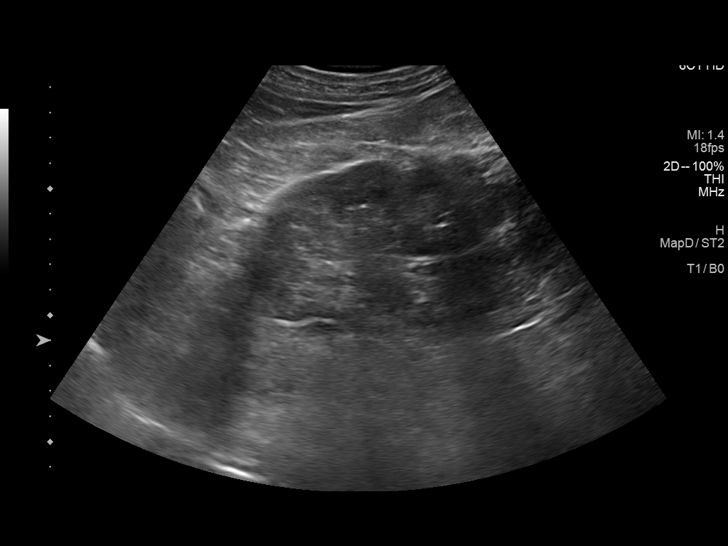
[im 23/35]
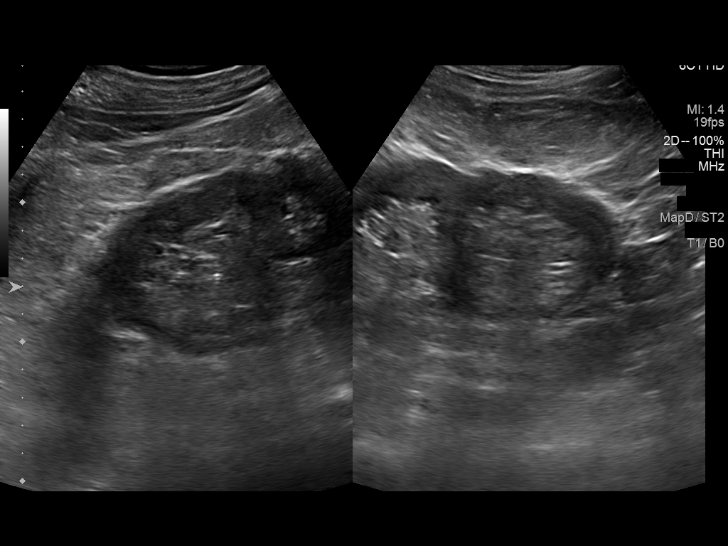
[im 26/35]
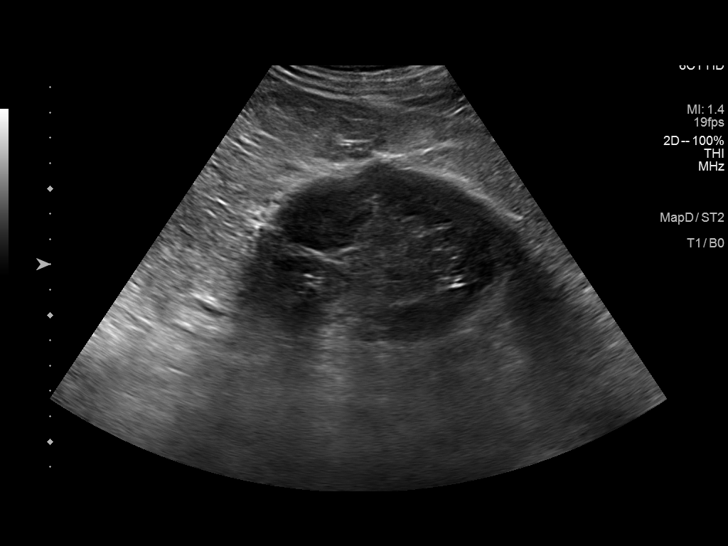
[im 29/35]
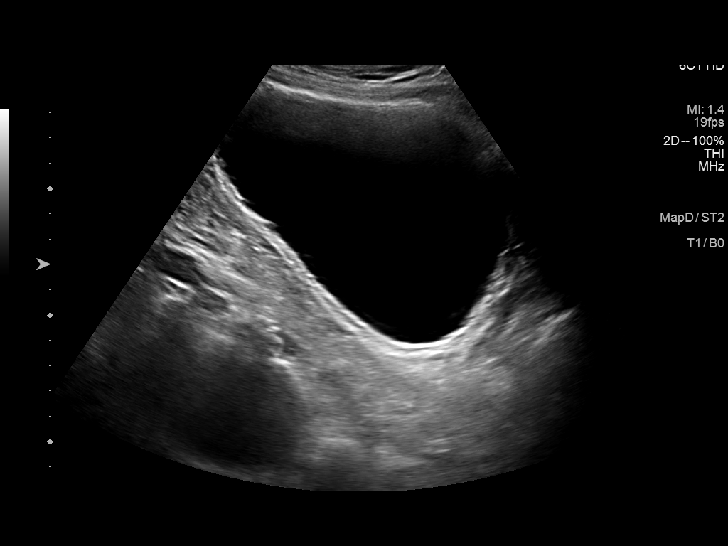
[im 32/35]
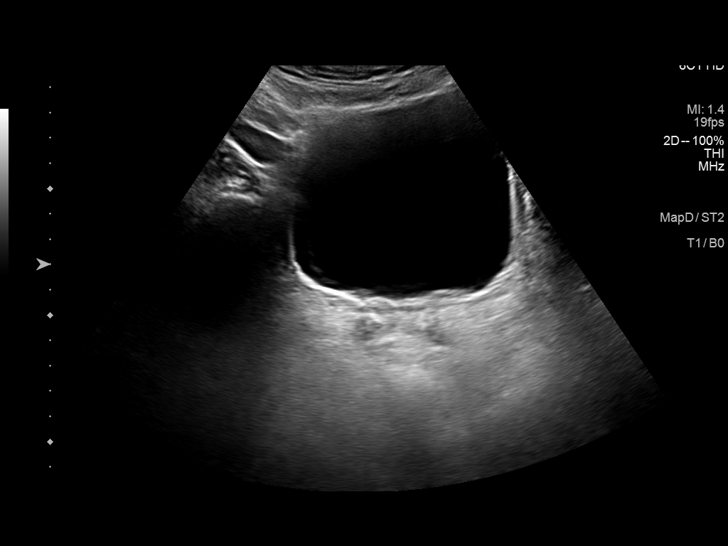
[im 35/35]
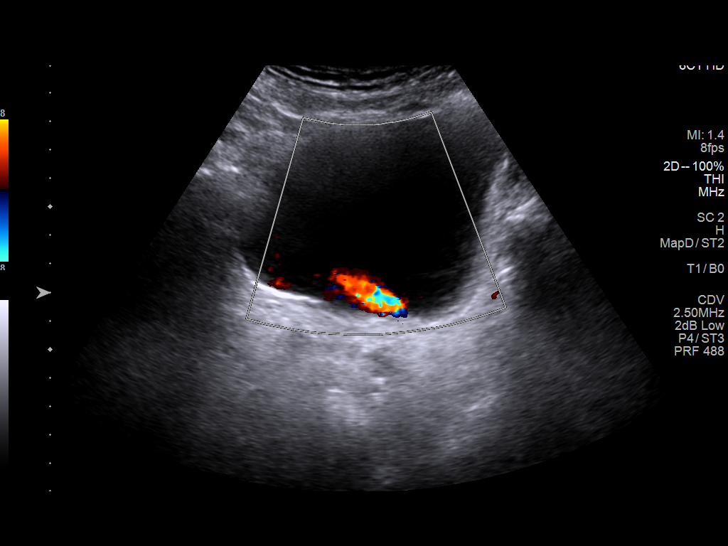

[14 of 25 positions shown; findings below may reference images not displayed]

FINDINGS: Right Kidney:

Renal measurements: 12.2 x 5.4 x 5.3 cm. = volume: 183 mL.
Echogenicity within normal limits. No mass or hydronephrosis
visualized.

Left Kidney:

Renal measurements: 13.4 x 5.9 x 6.0 cm = volume: 248 mL. Some
suggestion of mild cortical thinning is noted. No mass lesion or
hydronephrosis is noted.

Bladder:

Appears normal for degree of bladder distention.

Other:

None.
IMPRESSION: Suggestion of cortical thinning on the left although this may be
related to the larger size of the left kidney. No other focal
abnormality is noted.

## 2022-10-16 ENCOUNTER — Ambulatory Visit (INDEPENDENT_AMBULATORY_CARE_PROVIDER_SITE_OTHER): Payer: Medicare Other

## 2022-10-16 VITALS — Ht 68.0 in | Wt 200.0 lb

## 2022-10-16 DIAGNOSIS — Z0000 Encounter for general adult medical examination without abnormal findings: Secondary | ICD-10-CM | POA: Diagnosis not present

## 2022-10-16 NOTE — Progress Notes (Cosign Needed Addendum)
Virtual Visit via Telephone Note  I connected with  John Rowe on 10/16/22 at  9:15 AM EST by telephone and verified that I am speaking with the correct person using two identifiers.  Location: Patient: Home Provider: [place] Persons participating in the virtual visit: [place]   I discussed the limitations, risks, security and privacy concerns of performing an evaluation and management service by telephone and the availability of in person appointments. The patient expressed understanding and agreed to proceed.  Interactive audio and video telecommunications were attempted between this nurse and patient, however failed, due to patient having technical difficulties OR patient did not have access to video capability.  We continued and completed visit with audio only.  Some vital signs may be absent or patient reported.   Sheral Flow, LPN  Subjective:   John Rowe is a 77 y.o. male who presents for Medicare Annual/Subsequent preventive examination.  Review of Systems     Cardiac Risk Factors include: advanced age (>51mn, >>53women);dyslipidemia;hypertension;male gender;obesity (BMI >30kg/m2)     Objective:    Today's Vitals   10/16/22 0917 10/16/22 0918  Weight: 200 lb (90.7 kg)   Height: 5' 8"$  (1.727 m)   PainSc: 0-No pain 0-No pain   Body mass index is 30.41 kg/m.     10/16/2022    9:19 AM 10/14/2021    9:43 AM 04/02/2018    9:19 AM 12/21/2017    9:15 AM 10/11/2017   12:04 PM  Advanced Directives  Does Patient Have a Medical Advance Directive? No No No No No  Would patient like information on creating a medical advance directive? No - Patient declined No - Patient declined No - Patient declined      Current Medications (verified) Outpatient Encounter Medications as of 10/16/2022  Medication Sig   benazepril (LOTENSIN) 20 MG tablet Take 0.5 tablets (10 mg total) by mouth daily. Annual appt due in March must see provider for  future refills   cholecalciferol (VITAMIN D) 25 MCG (1000 UNIT) tablet TAKE 1 TABLET BY MOUTH EVERY DAY   Cyanocobalamin (VITAMIN B-12) 1000 MCG SUBL Place 1 tablet (1,000 mcg total) under the tongue daily.   pravastatin (PRAVACHOL) 20 MG tablet TAKE 1 TABLET BY MOUTH EVERY DAY   No facility-administered encounter medications on file as of 10/16/2022.    Allergies (verified) Oxycodone-acetaminophen, Tetracycline hcl, and Erythromycin base   History: Past Medical History:  Diagnosis Date   Arthritis    Cataract    beginning stage  both eyes   Chest pain    Hearing loss of left ear    Hyperlipidemia    Hypertension    Meniere disease    Past Surgical History:  Procedure Laterality Date   COLONOSCOPY     ESOPHAGOGASTRODUODENOSCOPY (EGD) WITH PROPOFOL N/A 10/11/2017   Procedure: ESOPHAGOGASTRODUODENOSCOPY (EGD) WITH PROPOFOL;  Surgeon: JMilus Banister MD;  Location: [facility];  Service: Endoscopy;  Laterality: N/A;   POLYPECTOMY     UPPER GASTROINTESTINAL ENDOSCOPY     Family History  Adopted: Yes  Problem Relation Age of Onset   Other Father        Deceased, sepsis   Healthy Child    Other Grandchild        1p36 syndrome   Colon polyps Neg Hx    Colon cancer Neg Hx    Esophageal cancer Neg Hx    Stomach cancer Neg Hx    Rectal cancer Neg Hx  Social History   Socioeconomic History   Marital status: Married    Spouse name: Not on file   Number of children: 3   Years of education: Not on file   Highest education level: Not on file  Occupational History   Occupation: retired  Tobacco Use   Smoking status: Former    Types: Cigarettes    Quit date: 09/12/1979    Years since quitting: 43.1   Smokeless tobacco: Never  Vaping Use   Vaping Use: Never used  Substance and Sexual Activity   Alcohol use: Yes    Alcohol/week: 0.0 standard drinks of alcohol    Comment: Drinks 3-4 beers nightly x 50 years   Drug use: No   Sexual activity: Yes  Other Topics Concern    Not on file  Social History Narrative   Lives with wife in a 4 story home.  Has 3 children and 7 grandkids.  Retired from [employer].     Education: some college.     Social Determinants of Health   Financial Resource Strain: Low Risk  (10/16/2022)   Overall Financial Resource Strain (CARDIA)    Difficulty of Paying Living Expenses: Not hard at all  Food Insecurity: No Food Insecurity (10/16/2022)   Hunger Vital Sign    Worried About Running Out of Food in the Last Year: Never true    Ran Out of Food in the Last Year: Never true  Transportation Needs: No Transportation Needs (10/16/2022)   PRAPARE - Hydrologist (Medical): No    Lack of Transportation (Non-Medical): No  Physical Activity: Sufficiently Active (10/16/2022)   Exercise Vital Sign    Days of Exercise per Week: 5 days    Minutes of Exercise per Session: 30 min  Stress: No Stress Concern Present (10/16/2022)   [place]    Feeling of Stress : Not at all  Social Connections: [place] (10/16/2022)   Social Connection and Isolation Panel [NHANES]    Frequency of Communication with Friends and Family: More than three times a week    Frequency of Social Gatherings with Friends and Family: More than three times a week    Attends Religious Services: More than 4 times per year    Active Member of [employer] or Organizations: Yes    Attends Music therapist: More than 4 times per year    Marital Status: Married    Tobacco Counseling Counseling given: Not Answered   Clinical Intake:  Pre-visit preparation completed: Yes  Pain : 0-10 Pain Score: 0-No pain     BMI - recorded: 30.41 Nutritional Status: BMI > 30  Obese Nutritional Risks: None Diabetes: No  How often do you need to have someone help you when you read instructions, pamphlets, or other written materials from your doctor or pharmacy?: 1 - Never What is the  last grade level you completed in school?: HSG  Diabetic? No  Interpreter Needed?: No  Information entered by :: Lisette Abu, LPN.   Activities of Daily Living    10/16/2022    9:22 AM  In your present state of health, do you have any difficulty performing the following activities:  Hearing? 1  Comment left ear  Vision? 0  Difficulty concentrating or making decisions? 0  Walking or climbing stairs? 0  Dressing or bathing? 0  Doing errands, shopping? 0  Preparing Food and eating ? N  Using the Toilet? N  In the past six months, have you accidently leaked urine? N  Do you have problems with loss of bowel control? N  Managing your Medications? N  Managing your Finances? N  Housekeeping or managing your Housekeeping? N    Patient Care Team: Plotnikov, Evie Lacks, MD as PCP - General (Internal Medicine) [facility] as Consulting Physician (Ophthalmology) Melina Schools, MD as Consulting Physician (Orthopedic Surgery)  Indicate any recent Medical Services you may have received from other than [facility] providers in the past year (date may be approximate).     Assessment:   This is a routine wellness examination for John Rowe.  Hearing/Vision screen Hearing Screening - Comments:: Patient has left ear hearing loss. Vision Screening - Comments:: Wears rx glasses/contacts - up to date with routine eye exams with [place] issues and exercise activities discussed: Current Exercise Habits: Home exercise routine, Type of exercise: Other - see comments (golf), Time (Minutes): 30, Frequency (Times/Week): 5, Weekly Exercise (Minutes/Week): 150, Intensity: Moderate, Exercise limited by: None identified   Goals Addressed   None   Depression Screen    10/16/2022    9:22 AM 11/22/2021   10:37 AM 10/14/2021    9:45 AM 10/11/2021    9:42 AM 10/07/2020    9:24 AM 10/07/2020    8:39 AM 04/07/2019    9:20 AM  PHQ 2/9 Scores  PHQ - 2 Score 0 0 0 0 0 0 0  PHQ-  9 Score   3 3       Fall Risk    10/16/2022    9:21 AM 11/22/2021   10:37 AM 10/14/2021    9:44 AM 10/11/2021    9:41 AM 10/07/2020    9:24 AM  Fall Risk   Falls in the past year? 0 0 1 1 0  Comment    pt states he was carrying something.. been having issues w/ back   Number falls in past yr: 0  0 0 0  Injury with Fall? 0  0 0 0  Risk for fall due to : No Fall Risks  No Fall Risks No Fall Risks No Fall Risks  Follow up Falls prevention discussed  Falls evaluation completed      FALL RISK PREVENTION PERTAINING TO THE HOME:  Any stairs in or around the home? Yes  If so, are there any without handrails? No  Home free of loose throw rugs in walkways, pet beds, electrical cords, etc? Yes  Adequate lighting in your home to reduce risk of falls? Yes   ASSISTIVE DEVICES UTILIZED TO PREVENT FALLS:  Life alert? No  Use of a cane, walker or w/c? No  Grab bars in the bathroom? No  Shower chair or bench in shower? No  Elevated toilet seat or a handicapped toilet? No   TIMED UP AND GO:  Was the test performed? No . Phone Visit  Cognitive Function:        10/16/2022    9:23 AM  6CIT Screen  What Year? 0 points  What month? 0 points  What time? 0 points  Count back from 20 0 points  Months in reverse 0 points  Repeat phrase 0 points  Total Score 0 points    Immunizations Immunization History  Administered Date(s) Administered   Fluad Quad(high Dose 65+) 10/07/2020, 05/25/2022   Influenza, High Dose Seasonal PF 09/25/2016   Influenza,inj,Quad PF,6+ Mos 09/24/2014   PFIZER(Purple Top)SARS-COV-2 Vaccination 11/09/2019, 12/09/2019, 07/25/2020   PNEUMOCOCCAL CONJUGATE-20  11/22/2021   Pneumococcal Conjugate-13 09/24/2014   Pneumococcal Polysaccharide-23 04/06/2010   Td 09/11/2005   Tdap 10/07/2018    TDAP status: Up to date  Flu Vaccine status: Up to date  Pneumococcal vaccine status: Up to date  Covid-19 vaccine status: Completed vaccines  Qualifies for Shingles Vaccine?  Yes   Zostavax completed No   Shingrix Completed?: No.    Education has been provided regarding the importance of this vaccine. Patient has been advised to call insurance company to determine out of pocket expense if they have not yet received this vaccine. Advised may also receive vaccine at local pharmacy or Health Dept. Verbalized acceptance and understanding.  Screening Tests Health Maintenance  Topic Date Due   Hepatitis C Screening  Never done   Zoster Vaccines- Shingrix (1 of 2) Never done   Medicare Annual Wellness (AWV)  10/17/2023   DTaP/Tdap/Td (3 - Td or Tdap) 10/07/2028   Pneumonia Vaccine 9+ Years old  Completed   INFLUENZA VACCINE  Completed   HPV VACCINES  Aged Out   COVID-19 Vaccine  Discontinued   Fecal DNA (Cologuard)  Discontinued    Health Maintenance  Health Maintenance Due  Topic Date Due   Hepatitis C Screening  Never done   Zoster Vaccines- Shingrix (1 of 2) Never done    Colorectal cancer screening: No longer required.   Lung Cancer Screening: (Low Dose CT Chest recommended if Age 73-80 years, 30 pack-year currently smoking OR have quit w/in 15years.) does not qualify.   Lung Cancer Screening Referral: no  Additional Screening:  Hepatitis C Screening: does qualify; Completed no  Vision Screening: Recommended annual ophthalmology exams for early detection of glaucoma and other disorders of the eye. Is the patient up to date with their annual eye exam?  Yes  Who is the provider or what is the name of the office in which the patient attends annual eye exams? [facility] If pt is not established with a provider, would they like to be referred to a provider to establish care? No .   Dental Screening: Recommended annual dental exams for proper oral hygiene  Community Resource Referral / Chronic Care Management: CRR required this visit?  No   CCM required this visit?  No      Plan:     I have personally reviewed and noted the following in  the patient's chart:   Medical and social history Use of alcohol, tobacco or illicit drugs  Current medications and supplements including opioid prescriptions. Patient is not currently taking opioid prescriptions. Functional ability and status Nutritional status Physical activity Advanced directives List of other physicians Hospitalizations, surgeries, and ER visits in previous 12 months Vitals Screenings to include cognitive, depression, and falls Referrals and appointments  In addition, I have reviewed and discussed with patient certain preventive protocols, quality metrics, and best practice recommendations. A written personalized care plan for preventive services as well as general preventive health recommendations were provided to patient.     Sheral Flow, LPN   [id number]   Nurse Notes: N/A   Medical screening examination/treatment/procedure(s) were performed by non-physician practitioner and as supervising physician I was immediately available for consultation/collaboration.  I agree with above. Lew Dawes, MD

## 2022-10-16 NOTE — Patient Instructions (Signed)
[name] , Thank you for taking time to come for your Medicare Wellness Visit. I appreciate your ongoing commitment to your health goals. Please review the following plan we discussed and let me know if I can assist you in the future.   These are the goals we discussed:  Goals       Patient Stated (pt-stated)      Stay as healthy and as independent as possible. Continue to be active physically and socially. Enjoy life and family.        This is a list of the screening recommended for you and due dates:  Health Maintenance  Topic Date Due   Hepatitis C Screening: USPSTF Recommendation to screen - Ages 64-79 yo.  Never done   Zoster (Shingles) Vaccine (1 of 2) Never done   Medicare Annual Wellness Visit  10/17/2023   DTaP/Tdap/Td vaccine (3 - Td or Tdap) 10/07/2028   Pneumonia Vaccine  Completed   Flu Shot  Completed   HPV Vaccine  Aged Out   COVID-19 Vaccine  Discontinued   Cologuard (Stool DNA test)  Discontinued    Advanced directives: No  Conditions/risks identified: Yes  Next appointment: Follow up in one year for your annual wellness visit.   Preventive Care 40 Years and Older, Male  Preventive care refers to lifestyle choices and visits with your health care provider that can promote health and wellness. What does preventive care include? A yearly physical exam. This is also called an annual well check. Dental exams once or twice a year. Routine eye exams. Ask your health care provider how often you should have your eyes checked. Personal lifestyle choices, including: Daily care of your teeth and gums. Regular physical activity. Eating a healthy diet. Avoiding tobacco and drug use. Limiting alcohol use. Practicing safe sex. Taking low doses of aspirin every day. Taking vitamin and mineral supplements as recommended by your health care provider. What happens during an annual well check? The services and screenings done by your health care provider during your  annual well check will depend on your age, overall health, lifestyle risk factors, and family history of disease. Counseling  Your health care provider may ask you questions about your: Alcohol use. Tobacco use. Drug use. Emotional well-being. Home and relationship well-being. Sexual activity. Eating habits. History of falls. Memory and ability to understand (cognition). Work and work Statistician. Screening  You may have the following tests or measurements: Height, weight, and BMI. Blood pressure. Lipid and cholesterol levels. These may be checked every 5 years, or more frequently if you are over 57 years old. Skin check. Lung cancer screening. You may have this screening every year starting at age 44 if you have a 30-pack-year history of smoking and currently smoke or have quit within the past 15 years. Fecal occult blood test (FOBT) of the stool. You may have this test every year starting at age 25. Flexible sigmoidoscopy or colonoscopy. You may have a sigmoidoscopy every 5 years or a colonoscopy every 10 years starting at age 22. Prostate cancer screening. Recommendations will vary depending on your family history and other risks. Hepatitis C blood test. Hepatitis B blood test. Sexually transmitted disease (STD) testing. Diabetes screening. This is done by checking your blood sugar (glucose) after you have not eaten for a while (fasting). You may have this done every 1-3 years. Abdominal aortic aneurysm (AAA) screening. You may need this if you are a current or former smoker. Osteoporosis. You may be screened starting  at age 32 if you are at high risk. Talk with your health care provider about your test results, treatment options, and if necessary, the need for more tests. Vaccines  Your health care provider may recommend certain vaccines, such as: Influenza vaccine. This is recommended every year. Tetanus, diphtheria, and acellular pertussis (Tdap, Td) vaccine. You may need a Td  booster every 10 years. Zoster vaccine. You may need this after age 22. Pneumococcal 13-valent conjugate (PCV13) vaccine. One dose is recommended after age 69. Pneumococcal polysaccharide (PPSV23) vaccine. One dose is recommended after age 32. Talk to your health care provider about which screenings and vaccines you need and how often you need them. This information is not intended to replace advice given to you by your health care provider. Make sure you discuss any questions you have with your health care provider. Document Released: 09/24/2015 Document Revised: 05/17/2016 Document Reviewed: 06/29/2015 Elsevier Interactive Patient Education  2017 [place] Prevention in the Home Falls can cause injuries. They can happen to people of all ages. There are many things you can do to make your home safe and to help prevent falls. What can I do on the outside of my home? Regularly fix the edges of walkways and driveways and fix any cracks. Remove anything that might make you trip as you walk through a door, such as a raised step or threshold. Trim any bushes or trees on the path to your home. Use bright outdoor lighting. Clear any walking paths of anything that might make someone trip, such as rocks or tools. Regularly check to see if handrails are loose or broken. Make sure that both sides of any steps have handrails. Any raised decks and porches should have guardrails on the edges. Have any leaves, snow, or ice cleared regularly. Use sand or salt on walking paths during winter. Clean up any spills in your garage right away. This includes oil or grease spills. What can I do in the bathroom? Use night lights. Install grab bars by the toilet and in the tub and shower. Do not use towel bars as grab bars. Use non-skid mats or decals in the tub or shower. If you need to sit down in the shower, use a plastic, non-slip stool. Keep the floor dry. Clean up any water that spills on the floor  as soon as it happens. Remove soap buildup in the tub or shower regularly. Attach bath mats securely with double-sided non-slip rug tape. Do not have throw rugs and other things on the floor that can make you trip. What can I do in the bedroom? Use night lights. Make sure that you have a light by your bed that is easy to reach. Do not use any sheets or blankets that are too big for your bed. They should not hang down onto the floor. Have a firm chair that has side arms. You can use this for support while you get dressed. Do not have throw rugs and other things on the floor that can make you trip. What can I do in the kitchen? Clean up any spills right away. Avoid walking on wet floors. Keep items that you use a lot in easy-to-reach places. If you need to reach something above you, use a strong step stool that has a grab bar. Keep electrical cords out of the way. Do not use floor polish or wax that makes floors slippery. If you must use wax, use non-skid floor wax. Do not have throw rugs  and other things on the floor that can make you trip. What can I do with my stairs? Do not leave any items on the stairs. Make sure that there are handrails on both sides of the stairs and use them. Fix handrails that are broken or loose. Make sure that handrails are as long as the stairways. Check any carpeting to make sure that it is firmly attached to the stairs. Fix any carpet that is loose or worn. Avoid having throw rugs at the top or bottom of the stairs. If you do have throw rugs, attach them to the floor with carpet tape. Make sure that you have a light switch at the top of the stairs and the bottom of the stairs. If you do not have them, ask someone to add them for you. What else can I do to help prevent falls? Wear shoes that: Do not have high heels. Have rubber bottoms. Are comfortable and fit you well. Are closed at the toe. Do not wear sandals. If you use a stepladder: Make sure that it is  fully opened. Do not climb a closed stepladder. Make sure that both sides of the stepladder are locked into place. Ask someone to hold it for you, if possible. Clearly mark and make sure that you can see: Any grab bars or handrails. First and last steps. Where the edge of each step is. Use tools that help you move around (mobility aids) if they are needed. These include: Canes. Walkers. Scooters. Crutches. Turn on the lights when you go into a dark area. Replace any light bulbs as soon as they burn out. Set up your furniture so you have a clear path. Avoid moving your furniture around. If any of your floors are uneven, fix them. If there are any pets around you, be aware of where they are. Review your medicines with your doctor. Some medicines can make you feel dizzy. This can increase your chance of falling. Ask your doctor what other things that you can do to help prevent falls. This information is not intended to replace advice given to you by your health care provider. Make sure you discuss any questions you have with your health care provider. Document Released: 06/24/2009 Document Revised: 02/03/2016 Document Reviewed: 10/02/2014 Elsevier Interactive Patient Education  2017 [employer].

## 2022-10-25 ENCOUNTER — Other Ambulatory Visit: Payer: Self-pay | Admitting: Internal Medicine

## 2022-11-23 ENCOUNTER — Encounter: Payer: Self-pay | Admitting: Internal Medicine

## 2022-11-23 ENCOUNTER — Ambulatory Visit (INDEPENDENT_AMBULATORY_CARE_PROVIDER_SITE_OTHER): Payer: Medicare Other | Admitting: Internal Medicine

## 2022-11-23 ENCOUNTER — Telehealth: Payer: Self-pay | Admitting: Internal Medicine

## 2022-11-23 VITALS — BP 98/60 | HR 67 | Temp 97.6°F | Ht 68.0 in | Wt 198.0 lb

## 2022-11-23 DIAGNOSIS — E538 Deficiency of other specified B group vitamins: Secondary | ICD-10-CM

## 2022-11-23 DIAGNOSIS — D0339 Melanoma in situ of other parts of face: Secondary | ICD-10-CM | POA: Insufficient documentation

## 2022-11-23 DIAGNOSIS — N320 Bladder-neck obstruction: Secondary | ICD-10-CM

## 2022-11-23 DIAGNOSIS — E785 Hyperlipidemia, unspecified: Secondary | ICD-10-CM | POA: Diagnosis not present

## 2022-11-23 DIAGNOSIS — I2583 Coronary atherosclerosis due to lipid rich plaque: Secondary | ICD-10-CM

## 2022-11-23 DIAGNOSIS — I2510 Atherosclerotic heart disease of native coronary artery without angina pectoris: Secondary | ICD-10-CM

## 2022-11-23 DIAGNOSIS — L578 Other skin changes due to chronic exposure to nonionizing radiation: Secondary | ICD-10-CM | POA: Insufficient documentation

## 2022-11-23 DIAGNOSIS — D485 Neoplasm of uncertain behavior of skin: Secondary | ICD-10-CM

## 2022-11-23 DIAGNOSIS — I10 Essential (primary) hypertension: Secondary | ICD-10-CM | POA: Diagnosis not present

## 2022-11-23 DIAGNOSIS — Z85828 Personal history of other malignant neoplasm of skin: Secondary | ICD-10-CM | POA: Insufficient documentation

## 2022-11-23 DIAGNOSIS — C4362 Malignant melanoma of left upper limb, including shoulder: Secondary | ICD-10-CM | POA: Insufficient documentation

## 2022-11-23 DIAGNOSIS — Z85820 Personal history of malignant melanoma of skin: Secondary | ICD-10-CM | POA: Insufficient documentation

## 2022-11-23 NOTE — Assessment & Plan Note (Signed)
On B12 

## 2022-11-23 NOTE — Progress Notes (Signed)
Subjective:  Patient ID: John Rowe, male    DOB: 03/29/1946  Age: 77 y.o. MRN: [id number]  CC: Follow-up (6 mnth)   HPI CORAN TARDIE presents for HTN, dyslipidemia, B12 def  Outpatient Medications Prior to Visit  Medication Sig Dispense Refill   benazepril (LOTENSIN) 20 MG tablet Take 0.5 tablets (10 mg total) by mouth daily. Annual appt due in March must see provider for future refills 45 tablet 0   cholecalciferol (VITAMIN D) 25 MCG (1000 UNIT) tablet TAKE 1 TABLET BY MOUTH EVERY DAY 90 tablet 3   Cyanocobalamin (VITAMIN B-12) 1000 MCG SUBL Place 1 tablet (1,000 mcg total) under the tongue daily. 100 tablet 3   pravastatin (PRAVACHOL) 20 MG tablet TAKE 1 TABLET BY MOUTH EVERY DAY 90 tablet 3   No facility-administered medications prior to visit.    ROS: Review of Systems  Constitutional:  Negative for appetite change, fatigue and unexpected weight change.  HENT:  Negative for congestion, nosebleeds, sneezing, sore throat and trouble swallowing.   Eyes:  Negative for itching and visual disturbance.  Respiratory:  Negative for cough.   Cardiovascular:  Negative for chest pain, palpitations and leg swelling.  Gastrointestinal:  Negative for abdominal distention, blood in stool, diarrhea and nausea.  Genitourinary:  Negative for frequency and hematuria.  Musculoskeletal:  Positive for back pain. Negative for arthralgias, gait problem, joint swelling and neck pain.  Skin:  Negative for rash.  Neurological:  Negative for dizziness, tremors, speech difficulty and weakness.  Psychiatric/Behavioral:  Negative for agitation, dysphoric mood and sleep disturbance. The patient is not nervous/anxious.     Objective:  BP 98/60 (BP Location: Left Arm, Patient Position: Sitting, Cuff Size: Large)   Pulse 67   Temp 97.6 F (36.4 C) (Oral)   Ht '5\' 8"'$  (1.727 m)   Wt 198 lb (89.8 kg)   SpO2 96%   BMI 30.11 kg/m   BP Readings from Last 3 Encounters:  11/23/22 98/60  05/25/22  110/68  11/22/21 110/62    Wt Readings from Last 3 Encounters:  11/23/22 198 lb (89.8 kg)  10/16/22 200 lb (90.7 kg)  05/25/22 195 lb 9.6 oz (88.7 kg)    Physical Exam Constitutional:      General: He is not in acute distress.    Appearance: He is well-developed. He is obese.     Comments: NAD  Eyes:     Conjunctiva/sclera: Conjunctivae normal.     Pupils: Pupils are equal, round, and reactive to light.  Neck:     Thyroid: No thyromegaly.     Vascular: No JVD.  Cardiovascular:     Rate and Rhythm: Normal rate and regular rhythm.     Heart sounds: Normal heart sounds. No murmur heard.    No friction rub. No gallop.  Pulmonary:     Effort: Pulmonary effort is normal. No respiratory distress.     Breath sounds: Normal breath sounds. No wheezing or rales.  Chest:     Chest wall: No tenderness.  Abdominal:     General: Bowel sounds are normal. There is no distension.     Palpations: Abdomen is soft. There is no mass.     Tenderness: There is no abdominal tenderness. There is no guarding or rebound.  Musculoskeletal:        General: No tenderness. Normal range of motion.     Cervical back: Normal range of motion.  Lymphadenopathy:     Cervical: No cervical adenopathy.  Skin:  General: Skin is warm and dry.     Findings: No rash.  Neurological:     Mental Status: He is alert and oriented to person, place, and time.     Cranial Nerves: No cranial nerve deficit.     Motor: No abnormal muscle tone.     Coordination: Coordination normal.     Gait: Gait normal.     Deep Tendon Reflexes: Reflexes are normal and symmetric.  Psychiatric:        Behavior: Behavior normal.        Thought Content: Thought content normal.        Judgment: Judgment normal.    Skin cancer? under L eye    Lab Results  Component Value Date   WBC 9.7 05/25/2022   HGB 14.4 05/25/2022   HCT 42.7 05/25/2022   PLT 212.0 05/25/2022   GLUCOSE 101 (H) 05/25/2022   CHOL 198 11/22/2021   TRIG 121.0  11/22/2021   HDL 62.70 11/22/2021   LDLDIRECT 145.8 07/01/2010   LDLCALC 111 (H) 11/22/2021   ALT 20 05/25/2022   AST 20 05/25/2022   NA 138 05/25/2022   K 4.1 05/25/2022   CL 102 05/25/2022   CREATININE 1.24 05/25/2022   BUN 12 05/25/2022   CO2 30 05/25/2022   TSH 1.03 11/22/2021   PSA 0.12 11/22/2021   INR 1.0 RATIO 06/26/2006   HGBA1C 5.7 11/22/2021    US Renal  Result Date: 10/19/2020 CLINICAL DATA:  Decreased GFR EXAM: RENAL / URINARY TRACT ULTRASOUND COMPLETE COMPARISON:  None. FINDINGS: Right Kidney: Renal measurements: 12.2 x 5.4 x 5.3 cm. = volume: 183 mL. Echogenicity within normal limits. No mass or hydronephrosis visualized. Left Kidney: Renal measurements: 13.4 x 5.9 x 6.0 cm = volume: 248 mL. Some suggestion of mild cortical thinning is noted. No mass lesion or hydronephrosis is noted. Bladder: Appears normal for degree of bladder distention. Other: None. IMPRESSION: Suggestion of cortical thinning on the left although this may be related to the larger size of the left kidney. No other focal abnormality is noted. Electronically Signed   By: Inez Catalina M.D.   On: 10/19/2020 16:16    Assessment & Plan:   Problem List Items Addressed This Visit       Cardiovascular and Mediastinum   Essential hypertension   Coronary atherosclerosis    On Pravastatin      Relevant Orders   Comprehensive metabolic panel   CBC with Differential/Platelet   TSH   Urinalysis   Lipid panel   PSA     Musculoskeletal and Integument   Neoplasm of uncertain behavior of skin   Relevant Orders   Ambulatory referral to Dermatology     Other   Vitamin B12 deficiency    On B12      Dyslipidemia - Primary    Cont on Pravachol      Relevant Orders   Comprehensive metabolic panel   CBC with Differential/Platelet   TSH   Urinalysis   Lipid panel   PSA   Other Visit Diagnoses     Bladder neck obstruction       Relevant Orders   PSA         No orders of the defined  types were placed in this encounter.     Follow-up: Return in about 6 months (around 05/26/2023) for a follow-up visit.  Walker Kehr, MD

## 2022-11-23 NOTE — Assessment & Plan Note (Signed)
Cont on Pravachol °

## 2022-11-23 NOTE — Assessment & Plan Note (Signed)
Skin cancer? under L eye [facility]

## 2022-11-23 NOTE — Assessment & Plan Note (Signed)
On Pravastatin 

## 2022-11-23 NOTE — Telephone Encounter (Signed)
Patient was referred to a [facility] today 11/23/22, but he has seen a different one before and would like to continue seeing them. He would like for his referral to be sent to [facility] in [place].  Best callback is [phone].

## 2022-11-26 NOTE — Telephone Encounter (Signed)
Okay.  Thanks.

## 2022-11-29 ENCOUNTER — Other Ambulatory Visit (INDEPENDENT_AMBULATORY_CARE_PROVIDER_SITE_OTHER): Payer: Medicare Other

## 2022-11-29 DIAGNOSIS — E785 Hyperlipidemia, unspecified: Secondary | ICD-10-CM

## 2022-11-29 DIAGNOSIS — N320 Bladder-neck obstruction: Secondary | ICD-10-CM | POA: Diagnosis not present

## 2022-11-29 DIAGNOSIS — I2583 Coronary atherosclerosis due to lipid rich plaque: Secondary | ICD-10-CM | POA: Diagnosis not present

## 2022-11-29 DIAGNOSIS — I2510 Atherosclerotic heart disease of native coronary artery without angina pectoris: Secondary | ICD-10-CM

## 2022-11-29 LAB — CBC WITH DIFFERENTIAL/PLATELET
Basophils Absolute: 0 10*3/uL (ref 0.0–0.1)
Basophils Relative: 0.4 % (ref 0.0–3.0)
Eosinophils Absolute: 0.2 10*3/uL (ref 0.0–0.7)
Eosinophils Relative: 2.3 % (ref 0.0–5.0)
HCT: 43.1 % (ref 39.0–52.0)
Hemoglobin: 14.6 g/dL (ref 13.0–17.0)
Lymphocytes Relative: 36.1 % (ref 12.0–46.0)
Lymphs Abs: 3.3 10*3/uL (ref 0.7–4.0)
MCHC: 33.9 g/dL (ref 30.0–36.0)
MCV: 96.9 fl (ref 78.0–100.0)
Monocytes Absolute: 1 10*3/uL (ref 0.1–1.0)
Monocytes Relative: 11.2 % (ref 3.0–12.0)
Neutro Abs: 4.5 10*3/uL (ref 1.4–7.7)
Neutrophils Relative %: 50 % (ref 43.0–77.0)
Platelets: 222 10*3/uL (ref 150.0–400.0)
RBC: 4.44 Mil/uL (ref 4.22–5.81)
RDW: 13.8 % (ref 11.5–15.5)
WBC: 9.1 10*3/uL (ref 4.0–10.5)

## 2022-11-29 LAB — URINALYSIS
Ketones, ur: NEGATIVE
Leukocytes,Ua: NEGATIVE
Nitrite: NEGATIVE
Specific Gravity, Urine: 1.025 (ref 1.000–1.030)
Total Protein, Urine: NEGATIVE
Urine Glucose: NEGATIVE
Urobilinogen, UA: 0.2 (ref 0.0–1.0)
pH: 5.5 (ref 5.0–8.0)

## 2022-11-29 LAB — LIPID PANEL
Cholesterol: 176 mg/dL (ref 0–200)
HDL: 56.1 mg/dL (ref >39.00)
LDL Cholesterol: 106 mg/dL — ABNORMAL HIGH (ref 0–99)
NonHDL: 120.15
Total CHOL/HDL Ratio: 3
Triglycerides: 71 mg/dL (ref 0.0–149.0)
VLDL: 14.2 mg/dL (ref 0.0–40.0)

## 2022-11-29 LAB — COMPREHENSIVE METABOLIC PANEL
ALT: 13 U/L (ref 0–53)
AST: 16 U/L (ref 0–37)
Albumin: 4 g/dL (ref 3.5–5.2)
Alkaline Phosphatase: 60 U/L (ref 39–117)
BUN: 13 mg/dL (ref 6–23)
CO2: 26 mEq/L (ref 19–32)
Calcium: 9.3 mg/dL (ref 8.4–10.5)
Chloride: 103 mEq/L (ref 96–112)
Creatinine, Ser: 1.21 mg/dL (ref 0.40–1.50)
GFR: 57.08 mL/min — ABNORMAL LOW (ref >60.00)
Glucose, Bld: 103 mg/dL — ABNORMAL HIGH (ref 70–99)
Potassium: 4.1 mEq/L (ref 3.5–5.1)
Sodium: 137 mEq/L (ref 135–145)
Total Bilirubin: 0.8 mg/dL (ref 0.2–1.2)
Total Protein: 6.6 g/dL (ref 6.0–8.3)

## 2022-11-29 LAB — PSA: PSA: 0.27 ng/mL (ref 0.10–4.00)

## 2022-11-29 LAB — TSH: TSH: 1.32 u[IU]/mL (ref 0.35–5.50)

## 2022-12-28 ENCOUNTER — Other Ambulatory Visit: Payer: Self-pay | Admitting: Internal Medicine

## 2023-03-08 ENCOUNTER — Encounter: Payer: Self-pay | Admitting: Internal Medicine

## 2023-03-24 ENCOUNTER — Other Ambulatory Visit: Payer: Self-pay | Admitting: Internal Medicine

## 2023-03-28 ENCOUNTER — Other Ambulatory Visit: Payer: Self-pay | Admitting: Internal Medicine

## 2023-04-16 ENCOUNTER — Ambulatory Visit: Payer: Medicare Other | Admitting: Family Medicine

## 2023-04-16 ENCOUNTER — Encounter: Payer: Self-pay | Admitting: Family Medicine

## 2023-04-16 VITALS — BP 100/62 | HR 68 | Temp 98.0°F | Resp 20 | Ht 68.0 in | Wt 194.0 lb

## 2023-04-16 DIAGNOSIS — U071 COVID-19: Secondary | ICD-10-CM | POA: Diagnosis not present

## 2023-04-16 MED ORDER — ALBUTEROL SULFATE HFA 108 (90 BASE) MCG/ACT IN AERS: 2.0000 | INHALATION_SPRAY | Freq: Four times a day (QID) | RESPIRATORY_TRACT | 0 refills | Status: AC | PRN

## 2023-04-16 NOTE — Progress Notes (Signed)
Assessment & Plan:  1. COVID-19 Education provided on COVID-19.  Discussed symptom management including throat lozenges, chloraseptic spray, warm salt water gargles, hot tea/honey, cough syrup (Delsym), Tylenol Cold day and night, Vicks, and a humidifier at night.  Discussed appropriate technique and use of albuterol inhaler - albuterol (VENTOLIN HFA) 108 (90 Base) MCG/ACT inhaler; Inhale 2 puffs into the lungs every 6 (six) hours as needed for wheezing or shortness of breath.  Dispense: 18 g; Refill: 0   No results found for any visits on 04/16/23.  Follow up plan: Return if symptoms worsen or fail to improve.  Deliah Boston, MSN, APRN, FNP-C  Subjective:  HPI: John Rowe is a 77 y.o. male presenting on 04/16/2023 for Cough (Cough, ST, congestion, sweats and chills x 3 days /Took a covid test - NEG x 1 then retested today and was POS )  Patient complains of cough, runny nose, sore throat, shortness of breath, and sweats/chills . He denies sneezing, postnasal drainage, and wheezing. Onset of symptoms was 3 days ago, gradually improving since that time. He is drinking moderate amounts of fluids. Evaluation to date: at home COVID test positive. Treatment to date:  Robitussin Cough/Cold, Nyquil . He does not smoke.    ROS: Negative unless specifically indicated above in HPI.   Relevant past medical history reviewed and updated as indicated.   Allergies and medications reviewed and updated.   Current Outpatient Medications:    benazepril (LOTENSIN) 20 MG tablet, TAKE 1/2 TABLET BY MOUTH DAILY, Disp: 45 tablet, Rfl: 1   cholecalciferol (VITAMIN D3) 25 MCG (1000 UNIT) tablet, TAKE 1 TABLET BY MOUTH EVERY DAY, Disp: 90 tablet, Rfl: 3   Cyanocobalamin (VITAMIN B-12) 1000 MCG SUBL, Place 1 tablet (1,000 mcg total) under the tongue daily., Disp: 100 tablet, Rfl: 3   pravastatin (PRAVACHOL) 20 MG tablet, TAKE 1 TABLET BY MOUTH EVERY DAY, Disp: 90 tablet, Rfl: 3  Allergies  Allergen  Reactions   Oxycodone-Acetaminophen     REACTION: constipation   Tetracycline Hcl     REACTION: rash   Erythromycin Base Rash    Past reaction with rash to a mycin    Objective:   BP 100/62   Pulse 68   Temp 98 F (36.7 C)   Resp 20   Ht 5\' 8"  (1.727 m)   Wt 194 lb (88 kg)   BMI 29.50 kg/m    Physical Exam Vitals reviewed.  Constitutional:      General: He is not in acute distress.    Appearance: Normal appearance. He is not ill-appearing, toxic-appearing or diaphoretic.  HENT:     Head: Normocephalic and atraumatic.     Right Ear: Tympanic membrane, ear canal and external ear normal. There is no impacted cerumen.     Left Ear: Tympanic membrane, ear canal and external ear normal. There is no impacted cerumen.     Nose: Congestion present.     Mouth/Throat:     Mouth: Mucous membranes are moist.     Pharynx: Oropharynx is clear. No oropharyngeal exudate or posterior oropharyngeal erythema.     Tonsils: No tonsillar exudate or tonsillar abscesses.  Eyes:     General: No scleral icterus.       Right eye: No discharge.        Left eye: No discharge.     Conjunctiva/sclera: Conjunctivae normal.  Cardiovascular:     Rate and Rhythm: Normal rate.  Pulmonary:     Effort: Pulmonary effort is  normal. No respiratory distress.  Musculoskeletal:        General: Normal range of motion.     Cervical back: Normal range of motion.  Lymphadenopathy:     Cervical: No cervical adenopathy.  Skin:    General: Skin is warm and dry.  Neurological:     Mental Status: He is alert and oriented to person, place, and time. Mental status is at baseline.  Psychiatric:        Mood and Affect: Mood normal.        Behavior: Behavior normal.        Thought Content: Thought content normal.        Judgment: Judgment normal.

## 2023-04-16 NOTE — Patient Instructions (Signed)
Throat lozenges, chloraseptic spray, warm salt water gargles, hot tea/honey, cough syrup (Delsym), Tylenol Cold day and night, Vicks, and a humidifier at night.

## 2023-04-20 ENCOUNTER — Telehealth: Payer: Self-pay | Admitting: Internal Medicine

## 2023-04-20 NOTE — Telephone Encounter (Signed)
Patient saw Deliah Boston for Covid on 04/16/2023. He said he was not sure what he is supposed to do since he is still testing positive. Patient would like a call back at [phone].

## 2023-04-20 NOTE — Telephone Encounter (Signed)
Called pt no answer . LMOM covid can last up to one to two weeks if you have mild or moderate coronavirus disease, but severe cases can last months. Went over instructions Britney advise. If no better will need ov w/MD../lb  Return if symptoms worsen or fail to improve. Throat lozenges, chloraseptic spray, warm salt water gargles, hot tea/honey, cough syrup (Delsym), Tylenol Cold day and night, Vicks, and a humidifier at night.

## 2023-04-20 NOTE — Telephone Encounter (Signed)
Patient states he is just going to wait until next week

## 2023-04-26 ENCOUNTER — Encounter (INDEPENDENT_AMBULATORY_CARE_PROVIDER_SITE_OTHER): Payer: Self-pay

## 2023-05-24 ENCOUNTER — Ambulatory Visit (INDEPENDENT_AMBULATORY_CARE_PROVIDER_SITE_OTHER): Payer: Medicare Other | Admitting: Internal Medicine

## 2023-05-24 ENCOUNTER — Encounter: Payer: Self-pay | Admitting: Internal Medicine

## 2023-05-24 VITALS — BP 108/62 | HR 66 | Temp 98.4°F | Ht 68.0 in | Wt 197.8 lb

## 2023-05-24 DIAGNOSIS — Z23 Encounter for immunization: Secondary | ICD-10-CM | POA: Diagnosis not present

## 2023-05-24 DIAGNOSIS — I10 Essential (primary) hypertension: Secondary | ICD-10-CM

## 2023-05-24 DIAGNOSIS — E785 Hyperlipidemia, unspecified: Secondary | ICD-10-CM

## 2023-05-24 DIAGNOSIS — R739 Hyperglycemia, unspecified: Secondary | ICD-10-CM

## 2023-05-24 DIAGNOSIS — E538 Deficiency of other specified B group vitamins: Secondary | ICD-10-CM

## 2023-05-24 DIAGNOSIS — H8109 Meniere's disease, unspecified ear: Secondary | ICD-10-CM

## 2023-05-24 LAB — COMPREHENSIVE METABOLIC PANEL
ALT: 22 U/L (ref 0–53)
AST: 23 U/L (ref 0–37)
Albumin: 3.9 g/dL (ref 3.5–5.2)
Alkaline Phosphatase: 57 U/L (ref 39–117)
BUN: 12 mg/dL (ref 6–23)
CO2: 26 meq/L (ref 19–32)
Calcium: 9.2 mg/dL (ref 8.4–10.5)
Chloride: 103 meq/L (ref 96–112)
Creatinine, Ser: 1.21 mg/dL (ref 0.40–1.50)
GFR: 56.89 mL/min — ABNORMAL LOW (ref >60.00)
Glucose, Bld: 85 mg/dL (ref 70–99)
Potassium: 4 meq/L (ref 3.5–5.1)
Sodium: 138 meq/L (ref 135–145)
Total Bilirubin: 0.8 mg/dL (ref 0.2–1.2)
Total Protein: 6.8 g/dL (ref 6.0–8.3)

## 2023-05-24 NOTE — Assessment & Plan Note (Signed)
Diazepam prn 

## 2023-05-24 NOTE — Assessment & Plan Note (Signed)
Check glucose

## 2023-05-24 NOTE — Assessment & Plan Note (Signed)
On B12 

## 2023-05-24 NOTE — Assessment & Plan Note (Signed)
Continue on Lotensin

## 2023-05-24 NOTE — Progress Notes (Signed)
Subjective:  Patient ID: John Rowe, male    DOB: Nov 25, 1945  Age: 77 y.o. MRN: [id number]  CC: No chief complaint on file.   HPI John Rowe presents for HTN, dyslipidemia, CRI  Outpatient Medications Prior to Visit  Medication Sig Dispense Refill   albuterol (VENTOLIN HFA) 108 (90 Base) MCG/ACT inhaler Inhale 2 puffs into the lungs every 6 (six) hours as needed for wheezing or shortness of breath. 18 g 0   benazepril (LOTENSIN) 20 MG tablet TAKE 1/2 TABLET BY MOUTH DAILY 45 tablet 1   cholecalciferol (VITAMIN D3) 25 MCG (1000 UNIT) tablet TAKE 1 TABLET BY MOUTH EVERY DAY 90 tablet 3   Cyanocobalamin (VITAMIN B-12) 1000 MCG SUBL Place 1 tablet (1,000 mcg total) under the tongue daily. 100 tablet 3   pravastatin (PRAVACHOL) 20 MG tablet TAKE 1 TABLET BY MOUTH EVERY DAY 90 tablet 3   No facility-administered medications prior to visit.    ROS: Review of Systems  Constitutional:  Negative for appetite change, fatigue and unexpected weight change.  HENT:  Negative for congestion, nosebleeds, sneezing, sore throat and trouble swallowing.   Eyes:  Negative for itching and visual disturbance.  Respiratory:  Negative for cough.   Cardiovascular:  Negative for chest pain, palpitations and leg swelling.  Gastrointestinal:  Negative for abdominal distention, blood in stool, diarrhea and nausea.  Genitourinary:  Negative for frequency and hematuria.  Musculoskeletal:  Positive for arthralgias. Negative for back pain, gait problem, joint swelling and neck pain.  Skin:  Negative for rash.  Neurological:  Negative for dizziness, tremors, speech difficulty and weakness.  Psychiatric/Behavioral:  Negative for agitation, dysphoric mood, sleep disturbance and suicidal ideas. The patient is not nervous/anxious.     Objective:  BP 108/62 (BP Location: Left Arm, Patient Position: Sitting, Cuff Size: Normal)   Pulse 66   Temp 98.4 F (36.9 C) (Oral)   Ht 5\' 8"  (1.727 m)   Wt 197 lb 12.8  oz (89.7 kg)   SpO2 97%   BMI 30.08 kg/m   BP Readings from Last 3 Encounters:  05/24/23 108/62  04/16/23 100/62  11/23/22 98/60    Wt Readings from Last 3 Encounters:  05/24/23 197 lb 12.8 oz (89.7 kg)  04/16/23 194 lb (88 kg)  11/23/22 198 lb (89.8 kg)    Physical Exam Constitutional:      General: He is not in acute distress.    Appearance: He is well-developed. He is obese.     Comments: NAD  Eyes:     Conjunctiva/sclera: Conjunctivae normal.     Pupils: Pupils are equal, round, and reactive to light.  Neck:     Thyroid: No thyromegaly.     Vascular: No JVD.  Cardiovascular:     Rate and Rhythm: Normal rate and regular rhythm.     Heart sounds: Normal heart sounds. No murmur heard.    No friction rub. No gallop.  Pulmonary:     Effort: Pulmonary effort is normal. No respiratory distress.     Breath sounds: Normal breath sounds. No wheezing or rales.  Chest:     Chest wall: No tenderness.  Abdominal:     General: Bowel sounds are normal. There is no distension.     Palpations: Abdomen is soft. There is no mass.     Tenderness: There is no abdominal tenderness. There is no guarding or rebound.  Musculoskeletal:        General: No tenderness. Normal range of motion.  Cervical back: Normal range of motion.  Lymphadenopathy:     Cervical: No cervical adenopathy.  Skin:    General: Skin is warm and dry.     Findings: No rash.  Neurological:     Mental Status: He is alert and oriented to person, place, and time.     Cranial Nerves: No cranial nerve deficit.     Motor: No abnormal muscle tone.     Coordination: Coordination normal.     Gait: Gait normal.     Deep Tendon Reflexes: Reflexes are normal and symmetric.  Psychiatric:        Behavior: Behavior normal.        Thought Content: Thought content normal.        Judgment: Judgment normal.     Lab Results  Component Value Date   WBC 9.1 11/29/2022   HGB 14.6 11/29/2022   HCT 43.1 11/29/2022   PLT  222.0 11/29/2022   GLUCOSE 103 (H) 11/29/2022   CHOL 176 11/29/2022   TRIG 71.0 11/29/2022   HDL 56.10 11/29/2022   LDLDIRECT 145.8 07/01/2010   LDLCALC 106 (H) 11/29/2022   ALT 13 11/29/2022   AST 16 11/29/2022   NA 137 11/29/2022   K 4.1 11/29/2022   CL 103 11/29/2022   CREATININE 1.21 11/29/2022   BUN 13 11/29/2022   CO2 26 11/29/2022   TSH 1.32 11/29/2022   PSA 0.27 11/29/2022   INR 1.0 RATIO 06/26/2006   HGBA1C 5.7 11/22/2021    US Renal  Result Date: 10/19/2020 CLINICAL DATA:  Decreased GFR EXAM: RENAL / URINARY TRACT ULTRASOUND COMPLETE COMPARISON:  None. FINDINGS: Right Kidney: Renal measurements: 12.2 x 5.4 x 5.3 cm. = volume: 183 mL. Echogenicity within normal limits. No mass or hydronephrosis visualized. Left Kidney: Renal measurements: 13.4 x 5.9 x 6.0 cm = volume: 248 mL. Some suggestion of mild cortical thinning is noted. No mass lesion or hydronephrosis is noted. Bladder: Appears normal for degree of bladder distention. Other: None. IMPRESSION: Suggestion of cortical thinning on the left although this may be related to the larger size of the left kidney. No other focal abnormality is noted. Electronically Signed   By: Alcide Clever M.D.   On: 10/19/2020 16:16    Assessment & Plan:   Problem List Items Addressed This Visit     Dyslipidemia    Cont on Pravachol      Meniere's disease    Diazepam prn      Essential hypertension    Continue on Lotensin      Relevant Orders   Comprehensive metabolic panel   Vitamin B12 deficiency    On B12      Hyperglycemia    Check glucose      Other Visit Diagnoses     Need for vaccination    -  Primary   Relevant Orders   Flu Vaccine Trivalent High Dose (Fluad) (Completed)         No orders of the defined types were placed in this encounter.     Follow-up: Return in about 6 months (around 11/21/2023) for Wellness Exam.  Sonda Primes, MD

## 2023-05-24 NOTE — Assessment & Plan Note (Signed)
Cont on Pravachol

## 2023-10-07 ENCOUNTER — Other Ambulatory Visit: Payer: Self-pay | Admitting: Internal Medicine

## 2023-10-15 HISTORY — PX: MOLE REMOVAL: SHX2046

## 2023-10-19 ENCOUNTER — Ambulatory Visit (INDEPENDENT_AMBULATORY_CARE_PROVIDER_SITE_OTHER): Payer: Medicare Other

## 2023-10-19 DIAGNOSIS — Z0000 Encounter for general adult medical examination without abnormal findings: Secondary | ICD-10-CM

## 2023-10-19 NOTE — Progress Notes (Addendum)
 Subjective:   John Rowe is a 78 y.o. male who presents for Medicare Annual/Subsequent preventive examination.  Visit Complete: Virtual I connected with  [name] on 10/19/23 by a audio enabled telemedicine application and verified that I am speaking with the correct person using two identifiers. Interactive audio and video telecommunications were attempted between this provider and patient, however failed, due to patient having technical difficulties OR patient did not have access to video capability.  We continued and completed visit with audio only.  Patient Location: Home  Provider Location: Home Office  I discussed the limitations of evaluation and management by telemedicine. The patient expressed understanding and agreed to proceed.  Vital Signs: Because this visit was a virtual/telehealth visit, some criteria may be missing or patient reported. Any vitals not documented were not able to be obtained and vitals that have been documented are patient reported.  Patient Medicare AWV questionnaire was completed by the patient on 10/17/2023; I have confirmed that all information answered by patient is correct and no changes since this date.  Cardiac Risk Factors include: advanced age (>63men, >74 women);dyslipidemia;hypertension;male gender     Objective:    Today's Vitals   There is no height or weight on file to calculate BMI.     10/19/2023    9:31 AM 10/16/2022    9:19 AM 10/14/2021    9:43 AM 04/02/2018    9:19 AM 12/21/2017    9:15 AM 10/11/2017   12:04 PM  Advanced Directives  Does Patient Have a Medical Advance Directive? No No No No No No  Would patient like information on creating a medical advance directive? No - Patient declined No - Patient declined No - Patient declined No - Patient declined      Current Medications (verified) Outpatient Encounter Medications as of 10/19/2023  Medication Sig   albuterol  (VENTOLIN  HFA) 108 (90 Base) MCG/ACT inhaler Inhale 2  puffs into the lungs every 6 (six) hours as needed for wheezing or shortness of breath.   benazepril  (LOTENSIN ) 20 MG tablet TAKE 1/2 TABLET BY MOUTH DAILY   cholecalciferol (VITAMIN D3) 25 MCG (1000 UNIT) tablet TAKE 1 TABLET BY MOUTH EVERY DAY   Cyanocobalamin  (VITAMIN B-12) 1000 MCG SUBL Place 1 tablet (1,000 mcg total) under the tongue daily.   pravastatin  (PRAVACHOL ) 20 MG tablet TAKE 1 TABLET BY MOUTH EVERY DAY   No facility-administered encounter medications on file as of 10/19/2023.    Allergies (verified) Oxycodone-acetaminophen , Tetracycline hcl, and Erythromycin base   History: Past Medical History:  Diagnosis Date   Arthritis    Cataract    beginning stage  both eyes   Chest pain    Hearing loss of left ear    Hyperlipidemia    Hypertension    Meniere disease    Past Surgical History:  Procedure Laterality Date   COLONOSCOPY     ESOPHAGOGASTRODUODENOSCOPY (EGD) WITH PROPOFOL  N/A 10/11/2017   Procedure: ESOPHAGOGASTRODUODENOSCOPY (EGD) WITH PROPOFOL ;  Surgeon: Teressa Toribio SQUIBB, MD;  Location: [facility];  Service: Endoscopy;  Laterality: N/A;   MOLE REMOVAL  10/15/2023   POLYPECTOMY     UPPER GASTROINTESTINAL ENDOSCOPY     Family History  Adopted: Yes  Problem Relation Age of Onset   Other Father        Deceased, sepsis   Healthy Child    Other Grandchild        1p36 syndrome   Colon polyps Neg Hx    Colon cancer Neg Hx  Esophageal cancer Neg Hx    Stomach cancer Neg Hx    Rectal cancer Neg Hx    Social History   Socioeconomic History   Marital status: Married    Spouse name: Not on file   Number of children: 3   Years of education: Not on file   Highest education level: Not on file  Occupational History   Occupation: retired  Tobacco Use   Smoking status: Former    Current packs/day: 0.00    Types: Cigarettes    Quit date: 09/12/1979    Years since quitting: 44.1   Smokeless tobacco: Never  Vaping Use   Vaping status: Never Used  Substance  and Sexual Activity   Alcohol use: Yes    Alcohol/week: 0.0 standard drinks of alcohol    Comment: Drinks 3-4 beers nightly x 50 years   Drug use: No   Sexual activity: Yes  Other Topics Concern   Not on file  Social History Narrative   Lives with wife in a 4 story home.  Has 3 children and 7 grandkids.  Retired from [employer].     Education: some college.     Social Drivers of Corporate Investment Banker Strain: Low Risk  (10/19/2023)   Overall Financial Resource Strain (CARDIA)    Difficulty of Paying Living Expenses: Not hard at all  Food Insecurity: No Food Insecurity (10/19/2023)   Hunger Vital Sign    Worried About Running Out of Food in the Last Year: Never true    Ran Out of Food in the Last Year: Never true  Transportation Needs: No Transportation Needs (10/19/2023)   PRAPARE - Administrator, Civil Service (Medical): No    Lack of Transportation (Non-Medical): No  Physical Activity: Sufficiently Active (10/19/2023)   Exercise Vital Sign    Days of Exercise per Week: 5 days    Minutes of Exercise per Session: 30 min  Stress: No Stress Concern Present (10/19/2023)   [employer] of Occupational Health - Occupational Stress Questionnaire    Feeling of Stress : Not at all  Social Connections: Moderately Integrated (10/19/2023)   Social Connection and Isolation Panel [NHANES]    Frequency of Communication with Friends and Family: More than three times a week    Frequency of Social Gatherings with Friends and Family: More than three times a week    Attends Religious Services: Never    Database Administrator or Organizations: Yes    Attends Engineer, Structural: More than 4 times per year    Marital Status: Married    Tobacco Counseling Counseling given: Not Answered   Clinical Intake:  Pre-visit preparation completed: Yes  Pain : No/denies pain     Nutritional Risks: None Diabetes: No  How often do you need to have someone help you when you read  instructions, pamphlets, or other written materials from your doctor or pharmacy?: 1 - Never  Interpreter Needed?: No  Information entered by :: NAllen LPN   Activities of Daily Living    10/17/2023   10:34 AM  In your present state of health, do you have any difficulty performing the following activities:  Hearing? 1  Comment has ringing in ears and decreased hearing left ear  Vision? 0  Difficulty concentrating or making decisions? 0  Walking or climbing stairs? 0  Dressing or bathing? 0  Doing errands, shopping? 0  Preparing Food and eating ? N  Using the Toilet? N  In  the past six months, have you accidently leaked urine? N  Do you have problems with loss of bowel control? N  Managing your Medications? N  Managing your Finances? N  Housekeeping or managing your Housekeeping? N    Patient Care Team: Plotnikov, Karlynn GAILS, MD as PCP - General (Internal Medicine) [facility] as Consulting Physician (Ophthalmology) Burnetta Aures, MD as Consulting Physician (Orthopedic Surgery)  Indicate any recent Medical Services you may have received from other than [facility] providers in the past year (date may be approximate).     Assessment:   This is a routine wellness examination for [place].  Hearing/Vision screen Hearing Screening - Comments:: Decreased hearing left ear Vision Screening - Comments:: Regular eye exams, [facility]   Goals Addressed             This Visit's Progress    Patient Stated       10/19/2023, denies goals       Depression Screen    10/19/2023    9:33 AM 05/24/2023    9:25 AM 04/16/2023    2:59 PM 11/23/2022    9:38 AM 10/16/2022    9:22 AM 11/22/2021   10:37 AM 10/14/2021    9:45 AM  PHQ 2/9 Scores  PHQ - 2 Score 0 0 0 0 0 0 0  PHQ- 9 Score 0      3    Fall Risk    10/17/2023   10:34 AM 05/24/2023    9:25 AM 04/16/2023    2:59 PM 11/23/2022    9:38 AM 10/16/2022    9:21 AM  Fall Risk   Falls in the past year? 1 0 0 0 0  Comment  slipped      Number falls in past yr: 0 0 0 0 0  Injury with Fall? 0 0 0 0 0  Risk for fall due to : Medication side effect No Fall Risks No Fall Risks No Fall Risks No Fall Risks  Follow up Falls prevention discussed;Falls evaluation completed Falls evaluation completed Falls evaluation completed Falls evaluation completed Falls prevention discussed    MEDICARE RISK AT HOME: Medicare Risk at Home Any stairs in or around the home?: (Patient-Rptd) Yes If so, are there any without handrails?: (Patient-Rptd) No Home free of loose throw rugs in walkways, pet beds, electrical cords, etc?: (Patient-Rptd) Yes Adequate lighting in your home to reduce risk of falls?: (Patient-Rptd) Yes Life alert?: (Patient-Rptd) No Use of a cane, walker or w/c?: (Patient-Rptd) No Grab bars in the bathroom?: (Patient-Rptd) Yes Shower chair or bench in shower?: (Patient-Rptd) Yes Elevated toilet seat or a handicapped toilet?: (Patient-Rptd) Yes  TIMED UP AND GO:  Was the test performed?  No    Cognitive Function:        10/19/2023    9:34 AM 10/16/2022    9:23 AM  6CIT Screen  What Year? 0 points 0 points  What month? 0 points 0 points  What time? 0 points 0 points  Count back from 20 0 points 0 points  Months in reverse 2 points 0 points  Repeat phrase 0 points 0 points  Total Score 2 points 0 points    Immunizations Immunization History  Administered Date(s) Administered   Fluad Quad(high Dose 65+) 10/07/2020, 05/25/2022   Fluad Trivalent(High Dose 65+) 05/24/2023   Influenza, High Dose Seasonal PF 09/25/2016   Influenza,inj,Quad PF,6+ Mos 09/24/2014   PFIZER(Purple Top)SARS-COV-2 Vaccination 11/09/2019, 12/09/2019, 07/25/2020   PNEUMOCOCCAL CONJUGATE-20 11/22/2021  Pneumococcal Conjugate-13 09/24/2014   Pneumococcal Polysaccharide-23 04/06/2010   Td 09/11/2005   Tdap 10/07/2018    TDAP status: Up to date  Flu Vaccine status: Up to date  Pneumococcal vaccine status: Up to  date  Covid-19 vaccine status: Information provided on how to obtain vaccines.   Qualifies for Shingles Vaccine? Yes   Zostavax completed No   Shingrix Completed?: No.    Education has been provided regarding the importance of this vaccine. Patient has been advised to call insurance company to determine out of pocket expense if they have not yet received this vaccine. Advised may also receive vaccine at local pharmacy or Health Dept. Verbalized acceptance and understanding.  Screening Tests Health Maintenance  Topic Date Due   Medicare Annual Wellness (AWV)  10/18/2024   DTaP/Tdap/Td (3 - Td or Tdap) 10/07/2028   Pneumonia Vaccine 21+ Years old  Completed   INFLUENZA VACCINE  Completed   HPV VACCINES  Aged Out   COVID-19 Vaccine  Discontinued   Fecal DNA (Cologuard)  Discontinued   Zoster Vaccines- Shingrix  Discontinued    Health Maintenance  There are no preventive care reminders to display for this patient.   Colorectal cancer screening: No longer required.   Lung Cancer Screening: (Low Dose CT Chest recommended if Age 65-80 years, 20 pack-year currently smoking OR have quit w/in 15years.) does not qualify.   Lung Cancer Screening Referral: no  Additional Screening:  Hepatitis C Screening: does not qualify;   Vision Screening: Recommended annual ophthalmology exams for early detection of glaucoma and other disorders of the eye. Is the patient up to date with their annual eye exam?  Yes  Who is the provider or what is the name of the office in which the patient attends annual eye exams? [facility] If pt is not established with a provider, would they like to be referred to a provider to establish care? No .   Dental Screening: Recommended annual dental exams for proper oral hygiene  Diabetic Foot Exam: n/a  Community Resource Referral / Chronic Care Management: CRR required this visit?  No   CCM required this visit?  No     Plan:     I have personally  reviewed and noted the following in the patient's chart:   Medical and social history Use of alcohol, tobacco or illicit drugs  Current medications and supplements including opioid prescriptions. Patient is not currently taking opioid prescriptions. Functional ability and status Nutritional status Physical activity Advanced directives List of other physicians Hospitalizations, surgeries, and ER visits in previous 12 months Vitals Screenings to include cognitive, depression, and falls Referrals and appointments  In addition, I have reviewed and discussed with patient certain preventive protocols, quality metrics, and best practice recommendations. A written personalized care plan for preventive services as well as general preventive health recommendations were provided to patient.     Ardella FORBES Dawn, LPN   03/12/7973   After Visit Summary: (MyChart) Due to this being a telephonic visit, the after visit summary with patients personalized plan was offered to patient via MyChart   Nurse Notes: none  Medical screening examination/treatment/procedure(s) were performed by non-physician practitioner and as supervising physician I was immediately available for consultation/collaboration.  I agree with above. Karlynn Noel, MD

## 2023-10-19 NOTE — Patient Instructions (Signed)
 [name] , Thank you for taking time to come for your Medicare Wellness Visit. I appreciate your ongoing commitment to your health goals. Please review the following plan we discussed and let me know if I can assist you in the future.   Referrals/Orders/Follow-Ups/Clinician Recommendations: none  This is a list of the screening recommended for you and due dates:  Health Maintenance  Topic Date Due   Medicare Annual Wellness Visit  10/18/2024   DTaP/Tdap/Td vaccine (3 - Td or Tdap) 10/07/2028   Pneumonia Vaccine  Completed   Flu Shot  Completed   HPV Vaccine  Aged Out   COVID-19 Vaccine  Discontinued   Cologuard (Stool DNA test)  Discontinued   Zoster (Shingles) Vaccine  Discontinued    Advanced directives: (Copy Requested) Please bring a copy of your health care power of attorney and living will to the office to be added to your chart at your convenience.  Next Medicare Annual Wellness Visit scheduled for next year: Yes  insert Preventive Care attachment Insert FALL PREVENTION attachment if needed

## 2023-11-21 ENCOUNTER — Ambulatory Visit (INDEPENDENT_AMBULATORY_CARE_PROVIDER_SITE_OTHER): Payer: Medicare Other | Admitting: Internal Medicine

## 2023-11-21 ENCOUNTER — Encounter: Payer: Self-pay | Admitting: Internal Medicine

## 2023-11-21 VITALS — BP 104/60 | HR 63 | Temp 98.0°F | Ht 68.0 in | Wt 198.0 lb

## 2023-11-21 DIAGNOSIS — E785 Hyperlipidemia, unspecified: Secondary | ICD-10-CM

## 2023-11-21 DIAGNOSIS — I10 Essential (primary) hypertension: Secondary | ICD-10-CM | POA: Diagnosis not present

## 2023-11-21 DIAGNOSIS — Z0000 Encounter for general adult medical examination without abnormal findings: Secondary | ICD-10-CM

## 2023-11-21 DIAGNOSIS — R739 Hyperglycemia, unspecified: Secondary | ICD-10-CM

## 2023-11-21 DIAGNOSIS — M170 Bilateral primary osteoarthritis of knee: Secondary | ICD-10-CM

## 2023-11-21 DIAGNOSIS — M179 Osteoarthritis of knee, unspecified: Secondary | ICD-10-CM | POA: Insufficient documentation

## 2023-11-21 DIAGNOSIS — E538 Deficiency of other specified B group vitamins: Secondary | ICD-10-CM

## 2023-11-21 NOTE — Assessment & Plan Note (Signed)
Continue on Lotensin

## 2023-11-21 NOTE — Assessment & Plan Note (Addendum)
 R>L knee w/pain Blue-Emu cream use 2-3 times a day on the knees Use Tylenol

## 2023-11-21 NOTE — Patient Instructions (Signed)
 Blue-Emu cream use 2-3 times a day on the knees Use Tylenol

## 2023-11-21 NOTE — Progress Notes (Signed)
 Subjective:  Patient ID: John Rowe, male    DOB: Feb 01, 1946  Age: 78 y.o. MRN: [id number]  CC: Medical Management of Chronic Issues (6 mnth f/u)   HPI John Rowe presents for B knee pain, skin cancer on the R nose, HTN  Outpatient Medications Prior to Visit  Medication Sig Dispense Refill   albuterol (VENTOLIN HFA) 108 (90 Base) MCG/ACT inhaler Inhale 2 puffs into the lungs every 6 (six) hours as needed for wheezing or shortness of breath. 18 g 0   benazepril (LOTENSIN) 20 MG tablet TAKE 1/2 TABLET BY MOUTH DAILY 45 tablet 1   cholecalciferol (VITAMIN D3) 25 MCG (1000 UNIT) tablet TAKE 1 TABLET BY MOUTH EVERY DAY 90 tablet 3   Cyanocobalamin (VITAMIN B-12) 1000 MCG SUBL Place 1 tablet (1,000 mcg total) under the tongue daily. 100 tablet 3   pravastatin (PRAVACHOL) 20 MG tablet TAKE 1 TABLET BY MOUTH EVERY DAY 90 tablet 3   No facility-administered medications prior to visit.    ROS: Review of Systems  Constitutional:  Negative for appetite change, fatigue and unexpected weight change.  HENT:  Negative for congestion, nosebleeds, sneezing, sore throat and trouble swallowing.   Eyes:  Negative for itching and visual disturbance.  Respiratory:  Negative for cough.   Cardiovascular:  Negative for chest pain, palpitations and leg swelling.  Gastrointestinal:  Negative for abdominal distention, blood in stool, diarrhea and nausea.  Genitourinary:  Negative for frequency and hematuria.  Musculoskeletal:  Positive for arthralgias and gait problem. Negative for back pain, joint swelling and neck pain.  Skin:  Negative for rash.  Neurological:  Negative for dizziness, tremors, speech difficulty and weakness.  Psychiatric/Behavioral:  Negative for agitation, dysphoric mood, sleep disturbance and suicidal ideas. The patient is not nervous/anxious.     Objective:  BP 104/60   Pulse 63   Temp 98 F (36.7 C) (Oral)   Ht 5\' 8"  (1.727 m)   Wt 198 lb (89.8 kg)   SpO2 98%   BMI  30.11 kg/m   BP Readings from Last 3 Encounters:  11/21/23 104/60  05/24/23 108/62  04/16/23 100/62    Wt Readings from Last 3 Encounters:  11/21/23 198 lb (89.8 kg)  05/24/23 197 lb 12.8 oz (89.7 kg)  04/16/23 194 lb (88 kg)    Physical Exam Constitutional:      General: He is not in acute distress.    Appearance: He is well-developed. He is obese.     Comments: NAD  Eyes:     Conjunctiva/sclera: Conjunctivae normal.     Pupils: Pupils are equal, round, and reactive to light.  Neck:     Thyroid: No thyromegaly.     Vascular: No JVD.  Cardiovascular:     Rate and Rhythm: Normal rate and regular rhythm.     Heart sounds: Normal heart sounds. No murmur heard.    No friction rub. No gallop.  Pulmonary:     Effort: Pulmonary effort is normal. No respiratory distress.     Breath sounds: Normal breath sounds. No wheezing or rales.  Chest:     Chest wall: No tenderness.  Abdominal:     General: Bowel sounds are normal. There is no distension.     Palpations: Abdomen is soft. There is no mass.     Tenderness: There is no abdominal tenderness. There is no guarding or rebound.  Musculoskeletal:        General: No tenderness. Normal range of motion.  Cervical back: Normal range of motion.  Lymphadenopathy:     Cervical: No cervical adenopathy.  Skin:    General: Skin is warm and dry.     Findings: No rash.  Neurological:     Mental Status: He is alert and oriented to person, place, and time.     Cranial Nerves: No cranial nerve deficit.     Motor: No abnormal muscle tone.     Coordination: Coordination normal.     Gait: Gait normal.     Deep Tendon Reflexes: Reflexes are normal and symmetric.  Psychiatric:        Behavior: Behavior normal.        Thought Content: Thought content normal.        Judgment: Judgment normal.   R>L knee w/pain R nose w/a scar  Lab Results  Component Value Date   WBC 9.1 11/29/2022   HGB 14.6 11/29/2022   HCT 43.1 11/29/2022   PLT  222.0 11/29/2022   GLUCOSE 85 05/24/2023   CHOL 176 11/29/2022   TRIG 71.0 11/29/2022   HDL 56.10 11/29/2022   LDLDIRECT 145.8 07/01/2010   LDLCALC 106 (H) 11/29/2022   ALT 22 05/24/2023   AST 23 05/24/2023   NA 138 05/24/2023   K 4.0 05/24/2023   CL 103 05/24/2023   CREATININE 1.21 05/24/2023   BUN 12 05/24/2023   CO2 26 05/24/2023   TSH 1.32 11/29/2022   PSA 0.27 11/29/2022   INR 1.0 RATIO 06/26/2006   HGBA1C 5.7 11/22/2021    US Renal Result Date: 10/19/2020 CLINICAL DATA:  Decreased GFR EXAM: RENAL / URINARY TRACT ULTRASOUND COMPLETE COMPARISON:  None. FINDINGS: Right Kidney: Renal measurements: 12.2 x 5.4 x 5.3 cm. = volume: 183 mL. Echogenicity within normal limits. No mass or hydronephrosis visualized. Left Kidney: Renal measurements: 13.4 x 5.9 x 6.0 cm = volume: 248 mL. Some suggestion of mild cortical thinning is noted. No mass lesion or hydronephrosis is noted. Bladder: Appears normal for degree of bladder distention. Other: None. IMPRESSION: Suggestion of cortical thinning on the left although this may be related to the larger size of the left kidney. No other focal abnormality is noted. Electronically Signed   By: Alcide Clever M.D.   On: 10/19/2020 16:16    Assessment & Plan:   Problem List Items Addressed This Visit     Dyslipidemia   Essential hypertension   Continue on Lotensin      Relevant Orders   TSH   Urinalysis   CBC with Differential/Platelet   Hyperglycemia   Relevant Orders   Hemoglobin A1c   Knee osteoarthritis   R>L knee w/pain Blue-Emu cream use 2-3 times a day on the knees Use Tylenol      Vitamin B12 deficiency - Primary   Relevant Orders   Vitamin B12   Well adult exam   Relevant Orders   Lipid panel   PSA   Comprehensive metabolic panel      No orders of the defined types were placed in this encounter.     Follow-up: Return in about 6 months (around 05/23/2024) for Wellness Exam.  Sonda Primes, MD

## 2023-11-22 ENCOUNTER — Other Ambulatory Visit (INDEPENDENT_AMBULATORY_CARE_PROVIDER_SITE_OTHER)

## 2023-11-22 DIAGNOSIS — R739 Hyperglycemia, unspecified: Secondary | ICD-10-CM | POA: Diagnosis not present

## 2023-11-22 DIAGNOSIS — E538 Deficiency of other specified B group vitamins: Secondary | ICD-10-CM | POA: Diagnosis not present

## 2023-11-22 DIAGNOSIS — Z0000 Encounter for general adult medical examination without abnormal findings: Secondary | ICD-10-CM | POA: Diagnosis not present

## 2023-11-22 DIAGNOSIS — I10 Essential (primary) hypertension: Secondary | ICD-10-CM

## 2023-11-22 LAB — COMPREHENSIVE METABOLIC PANEL
ALT: 20 U/L (ref 0–53)
AST: 22 U/L (ref 0–37)
Albumin: 4.2 g/dL (ref 3.5–5.2)
Alkaline Phosphatase: 59 U/L (ref 39–117)
BUN: 11 mg/dL (ref 6–23)
CO2: 29 meq/L (ref 19–32)
Calcium: 9.1 mg/dL (ref 8.4–10.5)
Chloride: 102 meq/L (ref 96–112)
Creatinine, Ser: 1.14 mg/dL (ref 0.40–1.50)
GFR: 60.89 mL/min (ref >60.00)
Glucose, Bld: 90 mg/dL (ref 70–99)
Potassium: 4.2 meq/L (ref 3.5–5.1)
Sodium: 140 meq/L (ref 135–145)
Total Bilirubin: 0.5 mg/dL (ref 0.2–1.2)
Total Protein: 6.8 g/dL (ref 6.0–8.3)

## 2023-11-22 LAB — LIPID PANEL
Cholesterol: 187 mg/dL (ref 0–200)
HDL: 48.5 mg/dL (ref >39.00)
LDL Cholesterol: 109 mg/dL — ABNORMAL HIGH (ref 0–99)
NonHDL: 138.36
Total CHOL/HDL Ratio: 4
Triglycerides: 146 mg/dL (ref 0.0–149.0)
VLDL: 29.2 mg/dL (ref 0.0–40.0)

## 2023-11-22 LAB — CBC WITH DIFFERENTIAL/PLATELET
Basophils Absolute: 0.1 10*3/uL (ref 0.0–0.1)
Basophils Relative: 1 % (ref 0.0–3.0)
Eosinophils Absolute: 0.2 10*3/uL (ref 0.0–0.7)
Eosinophils Relative: 2.9 % (ref 0.0–5.0)
HCT: 42.1 % (ref 39.0–52.0)
Hemoglobin: 14.4 g/dL (ref 13.0–17.0)
Lymphocytes Relative: 42.8 % (ref 12.0–46.0)
Lymphs Abs: 2.9 10*3/uL (ref 0.7–4.0)
MCHC: 34.2 g/dL (ref 30.0–36.0)
MCV: 98.7 fl (ref 78.0–100.0)
Monocytes Absolute: 0.7 10*3/uL (ref 0.1–1.0)
Monocytes Relative: 9.9 % (ref 3.0–12.0)
Neutro Abs: 3 10*3/uL (ref 1.4–7.7)
Neutrophils Relative %: 43.4 % (ref 43.0–77.0)
Platelets: 242 10*3/uL (ref 150.0–400.0)
RBC: 4.26 Mil/uL (ref 4.22–5.81)
RDW: 13.8 % (ref 11.5–15.5)
WBC: 6.9 10*3/uL (ref 4.0–10.5)

## 2023-11-22 LAB — URINALYSIS
Bilirubin Urine: NEGATIVE
Hgb urine dipstick: NEGATIVE
Ketones, ur: NEGATIVE
Leukocytes,Ua: NEGATIVE
Nitrite: NEGATIVE
Specific Gravity, Urine: 1.015 (ref 1.000–1.030)
Total Protein, Urine: NEGATIVE
Urine Glucose: NEGATIVE
Urobilinogen, UA: 0.2 (ref 0.0–1.0)
pH: 6 (ref 5.0–8.0)

## 2023-11-22 LAB — HEMOGLOBIN A1C: Hgb A1c MFr Bld: 5.8 % (ref 4.6–6.5)

## 2023-11-22 LAB — TSH: TSH: 0.98 u[IU]/mL (ref 0.35–5.50)

## 2023-11-22 LAB — VITAMIN B12: Vitamin B-12: >1537 pg/mL — ABNORMAL HIGH (ref 211–911)

## 2023-11-22 LAB — PSA: PSA: 0.31 ng/mL (ref 0.10–4.00)

## 2023-11-23 ENCOUNTER — Encounter: Payer: Self-pay | Admitting: Internal Medicine

## 2023-12-12 ENCOUNTER — Other Ambulatory Visit: Payer: Self-pay | Admitting: Internal Medicine

## 2024-02-12 ENCOUNTER — Encounter: Payer: Self-pay | Admitting: Internal Medicine

## 2024-05-20 ENCOUNTER — Ambulatory Visit: Admitting: Internal Medicine

## 2024-05-20 ENCOUNTER — Encounter: Payer: Self-pay | Admitting: Internal Medicine

## 2024-05-20 ENCOUNTER — Telehealth: Payer: Self-pay | Admitting: Radiology

## 2024-05-20 VITALS — BP 120/70 | HR 76 | Temp 97.7°F | Ht 68.0 in | Wt 199.6 lb

## 2024-05-20 DIAGNOSIS — H9193 Unspecified hearing loss, bilateral: Secondary | ICD-10-CM

## 2024-05-20 DIAGNOSIS — R739 Hyperglycemia, unspecified: Secondary | ICD-10-CM

## 2024-05-20 DIAGNOSIS — I2583 Coronary atherosclerosis due to lipid rich plaque: Secondary | ICD-10-CM | POA: Diagnosis not present

## 2024-05-20 DIAGNOSIS — E538 Deficiency of other specified B group vitamins: Secondary | ICD-10-CM

## 2024-05-20 DIAGNOSIS — I10 Essential (primary) hypertension: Secondary | ICD-10-CM

## 2024-05-20 DIAGNOSIS — H8109 Meniere's disease, unspecified ear: Secondary | ICD-10-CM | POA: Diagnosis not present

## 2024-05-20 DIAGNOSIS — M5441 Lumbago with sciatica, right side: Secondary | ICD-10-CM | POA: Diagnosis not present

## 2024-05-20 NOTE — Assessment & Plan Note (Signed)
Recurrent LBP Diazepam prn muscle spasms

## 2024-05-20 NOTE — Progress Notes (Signed)
 Subjective:  Patient ID: John Rowe, male    DOB: [birth year]  Age: 78 y.o. MRN: [id number]  CC: Follow-up (Patient states nothing to discuss and no concerns. )   HPI John Rowe presents for HTN, CAD, dyslipidemia  Outpatient Medications Prior to Visit  Medication Sig Dispense Refill   benazepril  (LOTENSIN ) 20 MG tablet TAKE 1/2 TABLET BY MOUTH DAILY 45 tablet 3   cholecalciferol (VITAMIN D3) 25 MCG (1000 UNIT) tablet TAKE 1 TABLET BY MOUTH EVERY DAY 96 tablet 3   Cyanocobalamin  (VITAMIN B-12) 1000 MCG SUBL Place 1 tablet (1,000 mcg total) under the tongue daily. 100 tablet 3   pravastatin  (PRAVACHOL ) 20 MG tablet TAKE 1 TABLET BY MOUTH EVERY DAY 90 tablet 3   albuterol  (VENTOLIN  HFA) 108 (90 Base) MCG/ACT inhaler Inhale 2 puffs into the lungs every 6 (six) hours as needed for wheezing or shortness of breath. (Patient not taking: Reported on 05/20/2024) 18 g 0   No facility-administered medications prior to visit.    ROS: Review of Systems  Constitutional:  Negative for appetite change, fatigue and unexpected weight change.  HENT:  Negative for congestion, nosebleeds, sneezing, sore throat and trouble swallowing.   Eyes:  Negative for itching and visual disturbance.  Respiratory:  Negative for cough.   Cardiovascular:  Negative for chest pain, palpitations and leg swelling.  Gastrointestinal:  Negative for abdominal distention, blood in stool, diarrhea and nausea.  Genitourinary:  Negative for frequency and hematuria.  Musculoskeletal:  Negative for back pain, gait problem, joint swelling and neck pain.  Skin:  Negative for rash.  Neurological:  Negative for dizziness, tremors, speech difficulty and weakness.  Psychiatric/Behavioral:  Negative for agitation, dysphoric mood and sleep disturbance. The patient is not nervous/anxious.     Objective:  BP 120/70   Pulse 76   Temp 97.7 F (36.5 C) (Oral)   Ht 5' 8 (1.727 m)   Wt 199 lb 9.6 oz (90.5 kg)   SpO2 96%   BMI  30.35 kg/m   BP Readings from Last 3 Encounters:  05/20/24 120/70  11/21/23 104/60  05/24/23 108/62    Wt Readings from Last 3 Encounters:  05/20/24 199 lb 9.6 oz (90.5 kg)  11/21/23 198 lb (89.8 kg)  05/24/23 197 lb 12.8 oz (89.7 kg)    Physical Exam Constitutional:      General: He is not in acute distress.    Appearance: He is well-developed. He is obese.     Comments: NAD  Eyes:     Conjunctiva/sclera: Conjunctivae normal.     Pupils: Pupils are equal, round, and reactive to light.  Neck:     Thyroid : No thyromegaly.     Vascular: No JVD.  Cardiovascular:     Rate and Rhythm: Normal rate and regular rhythm.     Heart sounds: Normal heart sounds. No murmur heard.    No friction rub. No gallop.  Pulmonary:     Effort: Pulmonary effort is normal. No respiratory distress.     Breath sounds: Normal breath sounds. No wheezing or rales.  Chest:     Chest wall: No tenderness.  Abdominal:     General: Bowel sounds are normal. There is no distension.     Palpations: Abdomen is soft. There is no mass.     Tenderness: There is no abdominal tenderness. There is no guarding or rebound.  Musculoskeletal:        General: No tenderness. Normal range of motion.  Cervical back: Normal range of motion.  Lymphadenopathy:     Cervical: No cervical adenopathy.  Skin:    General: Skin is warm and dry.     Findings: No rash.  Neurological:     Mental Status: He is alert and oriented to person, place, and time.     Cranial Nerves: No cranial nerve deficit.     Motor: No abnormal muscle tone.     Coordination: Coordination normal.     Gait: Gait normal.     Deep Tendon Reflexes: Reflexes are normal and symmetric.  Psychiatric:        Behavior: Behavior normal.        Thought Content: Thought content normal.        Judgment: Judgment normal.     Lab Results  Component Value Date   WBC 6.9 11/22/2023   HGB 14.4 11/22/2023   HCT 42.1 11/22/2023   PLT 242.0 11/22/2023    GLUCOSE 101 (H) 05/27/2024   CHOL 187 11/22/2023   TRIG 146.0 11/22/2023   HDL 48.50 11/22/2023   LDLDIRECT 145.8 07/01/2010   LDLCALC 109 (H) 11/22/2023   ALT 19 05/27/2024   AST 21 05/27/2024   NA 134 (L) 05/27/2024   K 3.9 05/27/2024   CL 104 05/27/2024   CREATININE 1.23 05/27/2024   BUN 13 05/27/2024   CO2 24 05/27/2024   TSH 0.98 11/22/2023   PSA 0.31 11/22/2023   INR 1.0 RATIO 06/26/2006   HGBA1C 6.0 05/27/2024    US  Renal Result Date: 10/19/2020 CLINICAL DATA:  Decreased GFR EXAM: RENAL / URINARY TRACT ULTRASOUND COMPLETE COMPARISON:  None. FINDINGS: Right Kidney: Renal measurements: 12.2 x 5.4 x 5.3 cm. = volume: 183 mL. Echogenicity within normal limits. No mass or hydronephrosis visualized. Left Kidney: Renal measurements: 13.4 x 5.9 x 6.0 cm = volume: 248 mL. Some suggestion of mild cortical thinning is noted. No mass lesion or hydronephrosis is noted. Bladder: Appears normal for degree of bladder distention. Other: None. IMPRESSION: Suggestion of cortical thinning on the left although this may be related to the larger size of the left kidney. No other focal abnormality is noted. Electronically Signed   By: Oneil Devonshire M.D.   On: 10/19/2020 16:16    Assessment & Plan:   Problem List Items Addressed This Visit     Coronary atherosclerosis   On Pravastatin       Essential hypertension   Continue on Lotensin  Possible excessive beer drinking      Hearing loss   [facility] via [facility]      Hyperglycemia - Primary   Check glucose      Relevant Orders   Comprehensive metabolic panel with GFR (Completed)   Hemoglobin A1c (Completed)   Low back pain   Recurrent LBP Diazepam  prn muscle spasms      Meniere's disease   No relapse      Vitamin B12 deficiency   On B12         No orders of the defined types were placed in this encounter.     Follow-up: Return in about 3 months (around 08/19/2024) for a follow-up visit.  Marolyn Noel, MD

## 2024-05-20 NOTE — Assessment & Plan Note (Signed)
Check glucose

## 2024-05-20 NOTE — Telephone Encounter (Signed)
 Copied from CRM [id number]. Topic: Appointments - Appointment Info/Confirmation >> May 20, 2024 10:45 AM Rosina BIRCH wrote: Patient/patient representative is calling for information regarding an appointment.   Patient want to know if he need to do labs on his next visit or when he returns in 6 months  [phone]

## 2024-05-20 NOTE — Assessment & Plan Note (Addendum)
 Continue on Lotensin  Possible excessive beer drinking

## 2024-05-20 NOTE — Assessment & Plan Note (Signed)
 On B12

## 2024-05-20 NOTE — Assessment & Plan Note (Signed)
On Pravastatin 

## 2024-05-20 NOTE — Assessment & Plan Note (Signed)
 Get hearing aids via [place]

## 2024-05-20 NOTE — Assessment & Plan Note (Signed)
No relapse 

## 2024-05-23 NOTE — Telephone Encounter (Signed)
 Called and spoke with patient about future lab orders placed. Pt verified didn't get any lab work done last visit and was making sure they were to be done next visit. Informed him of next appointments and labs that were ordered.

## 2024-05-27 ENCOUNTER — Other Ambulatory Visit (INDEPENDENT_AMBULATORY_CARE_PROVIDER_SITE_OTHER)

## 2024-05-27 DIAGNOSIS — R739 Hyperglycemia, unspecified: Secondary | ICD-10-CM

## 2024-05-27 LAB — HEMOGLOBIN A1C: Hgb A1c MFr Bld: 6 % (ref 4.6–6.5)

## 2024-05-28 ENCOUNTER — Ambulatory Visit: Payer: Self-pay | Admitting: Internal Medicine

## 2024-05-28 DIAGNOSIS — E871 Hypo-osmolality and hyponatremia: Secondary | ICD-10-CM | POA: Insufficient documentation

## 2024-05-28 DIAGNOSIS — I10 Essential (primary) hypertension: Secondary | ICD-10-CM

## 2024-05-31 LAB — COMPREHENSIVE METABOLIC PANEL WITH GFR
ALT: 19 U/L (ref 0–53)
AST: 21 U/L (ref 0–37)
Albumin: 4.2 g/dL (ref 3.5–5.2)
Alkaline Phosphatase: 52 U/L (ref 39–117)
BUN: 13 mg/dL (ref 6–23)
CO2: 24 meq/L (ref 19–32)
Calcium: 9.3 mg/dL (ref 8.4–10.5)
Chloride: 104 meq/L (ref 96–112)
Creatinine, Ser: 1.23 mg/dL (ref 0.40–1.50)
GFR: 55.39 mL/min — ABNORMAL LOW (ref >60.00)
Glucose, Bld: 101 mg/dL — ABNORMAL HIGH (ref 70–99)
Potassium: 3.9 meq/L (ref 3.5–5.1)
Sodium: 134 meq/L — ABNORMAL LOW (ref 135–145)
Total Bilirubin: 0.8 mg/dL (ref 0.2–1.2)
Total Protein: 6.9 g/dL (ref 6.0–8.3)

## 2024-06-01 NOTE — Addendum Note (Signed)
 Addended by: Akim Watkinson V on: 06/01/2024 11:41 PM   Modules accepted: Level of Service

## 2024-07-24 LAB — OPHTHALMOLOGY REPORT-SCANNED

## 2024-09-03 ENCOUNTER — Other Ambulatory Visit: Payer: Self-pay | Admitting: Internal Medicine

## 2024-10-20 ENCOUNTER — Ambulatory Visit: Payer: Medicare Other

## 2024-11-17 ENCOUNTER — Ambulatory Visit: Admitting: Internal Medicine
